# Patient Record
Sex: Male | Born: 1955 | Race: White | Hispanic: No | Marital: Married | Smoking: Never smoker
Health system: Southern US, Community
[De-identification: ages and names within clinical notes are randomized; demographics above are authoritative.]

## PROBLEM LIST (undated history)

## (undated) DIAGNOSIS — E119 Type 2 diabetes mellitus without complications: Secondary | ICD-10-CM

## (undated) DIAGNOSIS — I2510 Atherosclerotic heart disease of native coronary artery without angina pectoris: Secondary | ICD-10-CM

## (undated) HISTORY — PX: ANAL FISSURECTOMY: SUR608

## (undated) HISTORY — PX: GASTRIC BYPASS: SHX52

---

## 2018-03-16 DIAGNOSIS — I350 Nonrheumatic aortic (valve) stenosis: Secondary | ICD-10-CM | POA: Insufficient documentation

## 2018-03-27 HISTORY — PX: CARDIAC CATHETERIZATION: SHX172

## 2018-04-03 ENCOUNTER — Encounter: Payer: 59 | Admitting: Cardiothoracic Surgery

## 2018-04-04 ENCOUNTER — Institutional Professional Consult (permissible substitution) (INDEPENDENT_AMBULATORY_CARE_PROVIDER_SITE_OTHER): Payer: 59 | Admitting: Cardiothoracic Surgery

## 2018-04-04 ENCOUNTER — Encounter: Payer: Self-pay | Admitting: Cardiothoracic Surgery

## 2018-04-04 ENCOUNTER — Other Ambulatory Visit: Payer: Self-pay

## 2018-04-04 VITALS — BP 129/76 | HR 55 | Resp 16 | Ht 74.0 in | Wt 296.0 lb

## 2018-04-04 DIAGNOSIS — R0600 Dyspnea, unspecified: Secondary | ICD-10-CM | POA: Insufficient documentation

## 2018-04-04 DIAGNOSIS — I350 Nonrheumatic aortic (valve) stenosis: Secondary | ICD-10-CM

## 2018-04-04 DIAGNOSIS — I10 Essential (primary) hypertension: Secondary | ICD-10-CM | POA: Insufficient documentation

## 2018-04-04 NOTE — Progress Notes (Signed)
PCP is Patient, No Pcp Per Referring Provider is Caryl Adahiu, Jenyung Andy, MD  Chief Complaint  Patient presents with  . Aortic Stenosis    ECHO 5/21, CATH 03/21/18  Patient examined, images of coronary angiogram and transthoracic echocardiogram and CT scan of chest personally reviewed and counseled with patient.  HPI: 62 year old nondiabetic non-smoker with recent diagnosis of severe symptomatic aortic stenosis and single-vessel coronary artery disease presents for discussion of aortic valve replacement with combined CABG.  The patient developed symptoms of dyspnea on exertion starting approximately 6 months ago.  The symptoms have gradually increased in frequency and intensity and he most recently had an episode of PND.  He is also had symptoms of presyncope but denies chest pain.  He was evaluated at a walk-in clinic and was treated with oxygen and diuresis.  He was noted to have a systolic murmur and was referred to cardiology, Dr. Deno Etiennehu at [facility].  He underwent echocardiogram which showed probable calcified bicuspid valve with peak gradient 65 to 70 mmHg mean gradient of 55 mmHg and normal LVEDP.  The LAD had moderate to severe stenosis of the mid vessel confirmed by FFR measurement right-sided pressures included CVP of 5, wedge of 18, PA pressure 36/15 and LVEDP 11 cardiac output 4.7 L/min  The patient denies family history of aortic valve surgery but there is family history of coronary artery disease.  Patient is a never smoker and is not currently diabetic.   7 years ago the patient underwent Gastric stapling procedure for obesity and lost weight from over 400 pounds to 300 pounds currently.  His diabetes and hypertension resolved. He denies any cardiac difficulty with his gastric bypass surgery or difficulty with anesthesia or bleeding.  Patient denies any active dental complaints and has his teeth cleaned on a regular basis. Patient Had no knowledge of having a cardiac murmur in the  past.  Because the patient has been recommended for cardiac surgery his activity level has been significantly decreased over the past 2 weeks.  We discussed the procedure of AVR Using a tissue valve which is the patient's choice as he does not want to remain on anticoagulation lifelong..  We discussed the procedure of CABG using the mammary artery the bypass for  his LAD lesion.  History reviewed. No pertinent past medical history.  Past Surgical History:  Procedure Laterality Date  . ANAL FISSURECTOMY    . GASTRIC BYPASS      History reviewed. No pertinent family history.  Social History Social History   Tobacco Use  . Smoking status: Never Smoker  . Smokeless tobacco: Current User    Types: Chew  Substance Use Topics  . Alcohol use: Yes  . Drug use: Never    Current Outpatient Medications  Medication Sig Dispense Refill  . aspirin EC 81 MG tablet Take 81 mg by mouth daily.    Marland Kitchen. atorvastatin (LIPITOR) 80 MG tablet Take 80 mg by mouth daily.    . Glucosamine-Chondroitin 500-250 MG CAPS Take 2 capsules by mouth daily.    . hydrochlorothiazide (HYDRODIURIL) 25 MG tablet Take 25 mg by mouth daily.    . Methylcellulose, Laxative, (CITRUCEL) 500 MG TABS Take 2 tablets by mouth daily.    . metoprolol tartrate (LOPRESSOR) 25 MG tablet Take 25 mg by mouth daily.    . Multiple Vitamins-Minerals (PA MENS 50 PLUS VITAPAK PO) Take 1 tablet by mouth daily.    . Omega-3 Fatty Acids (DIALYVITE OMEGA-3 CONCENTRATE) 600 MG CAPS Take  1 capsule by mouth daily.    . Omega-3 Fatty Acids (FISH OIL) 1200 MG CPDR Take 1 capsule by mouth daily.     No current facility-administered medications for this visit.     No Known Allergies  Review of Systems                     Review of Systems :  [ y ] = yes, [  ] = no        General :  Weight gain [   ]    Weight loss  [   ]  Fatigue [yes]  Fever [  ]  Chills  [  ]                                          HEENT    Headache [  ]  Dizziness [   ]  Blurred vision [  ] Glaucoma  [  ]                          Nosebleeds [  ] Painful or loose teeth [  ]        Cardiac :  Chest pain/ pressure [yes]  Resting SOB [  ] exertional SOB [yes]                        Orthopnea [yes]  Pedal edema  [  ]  Palpitations [  ] Syncope/presyncope [ ]                         Paroxysmal nocturnal dyspnea [yes]         Pulmonary : cough [  ]  wheezing [  ]  Hemoptysis [  ] Sputum [  ] Snoring [  ]                              Pneumothorax [  ]  Sleep apnea [probable does not use BiPAP or CPAP]        GI : Vomiting [  ]  Dysphagia [  ]  Melena  [  ]  Abdominal pain [  ] BRBPR [  ]              Heart burn [  ]  Constipation [  ] Diarrhea  [  ] Colonoscopy [   ] Patient eats small meals because of gastric stapling procedure       GU : Hematuria [  ]  Dysuria [  ]  Nocturia [  ] UTI's [  ]        Vascular : Claudication [  ]  Rest pain [  ]  DVT [  ] Vein stripping [  ] leg ulcers [  ]                          TIA [  ] Stroke [  ]  Varicose veins [  ]        NEURO :  Headaches  [  ] Seizures [  ] Vision changes [  ] Paresthesias [  ]  Musculoskeletal :  Arthritis [  ] Gout  [  ]  Back pain [  ]  Joint pain [  ]        Skin :  Rash [  ]  Melanoma [  ] Sores [  ]        Heme : Bleeding problems [  ]Clotting Disorders [  ] Anemia [  ]Blood Transfusion [ ]         Endocrine : Diabetes [  ] Heat or Cold intolerance [  ] Polyuria [  ]excessive thirst [ ]         Psych : Depression [  ]  Anxiety [  ]  Psych hospitalizations [  ] Memory change [  ]       Right-hand-dominant No previous thoracic injuries or pneumothorax                                                                   BP 129/76 (BP Location: Left Arm, Patient Position: Sitting, Cuff Size: Large)   Pulse (!) 55   Resp 16   Ht 6\' 2"  (1.88 m)   Wt 296 lb (134.3 kg)   SpO2 97% Comment: ON RA  BMI 38.00 kg/m  Physical Exam      Physical  Exam  General: Obese engaging middle-aged male no acute distress accompanied by wife HEENT: Normocephalic pupils equal , dentition adequate Neck: Supple without JVD, adenopathy, or bruit Chest: Clear to auscultation, symmetrical breath sounds, no rhonchi, no tenderness             or deformity Cardiovascular: Regular rate and rhythm, high-pitched systolic ejection murmur of aortic stenosis , no gallop, peripheral pulses             palpable in all extremities Abdomen:  Soft, nontender, no palpable mass or organomegaly Extremities: Warm, well-perfused, no clubbing cyanosis edema or tenderness,              no venous stasis changes of the legs Rectal/GU: Deferred Neuro: Grossly non--focal and symmetrical throughout Skin: Clean and dry without rash or ulceration   Diagnostic Tests: CT scan shows no evidence of ascending aortic aneurysm, diameter 4.0 cm.  Lung parenchyma appears to be satisfactory, mild pleural effusion on the right side  Impression: Severe aortic stenosis with resting symptoms of PND as well as dyspnea on exertion and presyncope  Plan: I discussed the procedure of AVR-CABG in detail with the patient and wife.  I recommended AVR with a biologic prosthesis since the patient does not want lifelong anticoagulation requirement.  We will perform combined mammary artery graft to the LAD via sternotomy.  I discussed the expected postoperative recovery, the potential postoperative complications including pulmonary problems due to his obesity and his risks of combined AVR CABG including risk of stroke, bleeding, infection, organ failure, death.  He will be scheduled for AVR CABG at [facility] on June 28.  Will arrange his preoperative visit 48 hours prior to surgery.   Mikey Bussing, MD [facility] [phone]

## 2018-04-05 ENCOUNTER — Other Ambulatory Visit: Payer: Self-pay | Admitting: *Deleted

## 2018-04-05 DIAGNOSIS — I350 Nonrheumatic aortic (valve) stenosis: Secondary | ICD-10-CM

## 2018-04-05 DIAGNOSIS — I2510 Atherosclerotic heart disease of native coronary artery without angina pectoris: Secondary | ICD-10-CM

## 2018-04-11 ENCOUNTER — Ambulatory Visit (HOSPITAL_COMMUNITY)
Admission: RE | Admit: 2018-04-11 | Discharge: 2018-04-11 | Disposition: A | Discharge status: Home / Self Care | Payer: 59 | Source: Ambulatory Visit | Attending: Cardiothoracic Surgery | Admitting: Cardiothoracic Surgery

## 2018-04-11 ENCOUNTER — Encounter (HOSPITAL_COMMUNITY)
Admission: RE | Admit: 2018-04-11 | Discharge: 2018-04-11 | Disposition: A | Discharge status: Home / Self Care | Payer: 59 | Source: Ambulatory Visit | Attending: Cardiothoracic Surgery | Admitting: Cardiothoracic Surgery

## 2018-04-11 ENCOUNTER — Encounter (HOSPITAL_COMMUNITY): Payer: Self-pay

## 2018-04-11 ENCOUNTER — Other Ambulatory Visit (HOSPITAL_COMMUNITY): Payer: 59

## 2018-04-11 ENCOUNTER — Ambulatory Visit (HOSPITAL_BASED_OUTPATIENT_CLINIC_OR_DEPARTMENT_OTHER)
Admission: RE | Admit: 2018-04-11 | Discharge: 2018-04-11 | Disposition: A | Discharge status: Home / Self Care | Payer: 59 | Source: Ambulatory Visit | Attending: Cardiothoracic Surgery | Admitting: Cardiothoracic Surgery

## 2018-04-11 ENCOUNTER — Other Ambulatory Visit: Payer: Self-pay

## 2018-04-11 DIAGNOSIS — Z01818 Encounter for other preprocedural examination: Secondary | ICD-10-CM | POA: Insufficient documentation

## 2018-04-11 DIAGNOSIS — I2510 Atherosclerotic heart disease of native coronary artery without angina pectoris: Secondary | ICD-10-CM

## 2018-04-11 DIAGNOSIS — Z01812 Encounter for preprocedural laboratory examination: Secondary | ICD-10-CM

## 2018-04-11 DIAGNOSIS — Z0183 Encounter for blood typing: Secondary | ICD-10-CM

## 2018-04-11 DIAGNOSIS — I6523 Occlusion and stenosis of bilateral carotid arteries: Secondary | ICD-10-CM | POA: Insufficient documentation

## 2018-04-11 DIAGNOSIS — R001 Bradycardia, unspecified: Secondary | ICD-10-CM

## 2018-04-11 DIAGNOSIS — I350 Nonrheumatic aortic (valve) stenosis: Secondary | ICD-10-CM

## 2018-04-11 HISTORY — DX: Type 2 diabetes mellitus without complications: E11.9

## 2018-04-11 HISTORY — DX: Atherosclerotic heart disease of native coronary artery without angina pectoris: I25.10

## 2018-04-11 LAB — PULMONARY FUNCTION TEST
DL/VA % pred: 73 %
DL/VA: 3.57 ml/min/mmHg/L
DLCO cor % pred: 63 %
DLCO cor: 23.93 ml/min/mmHg
DLCO unc % pred: 54 %
DLCO unc: 20.52 ml/min/mmHg
FEF 25-75 Post: 3.93 L/sec
FEF 25-75 Pre: 3.44 L/sec
FEF2575-%Change-Post: 14 %
FEF2575-%Pred-Post: 118 %
FEF2575-%Pred-Pre: 104 %
FEV1-%Change-Post: 5 %
FEV1-%Pred-Post: 94 %
FEV1-%Pred-Pre: 89 %
FEV1-Post: 3.88 L
FEV1-Pre: 3.68 L
FEV1FVC-%Change-Post: 4 %
FEV1FVC-%Pred-Pre: 107 %
FEV6-%Change-Post: 0 %
FEV6-%Pred-Post: 88 %
FEV6-%Pred-Pre: 87 %
FEV6-Post: 4.6 L
FEV6-Pre: 4.57 L
FEV6FVC-%Pred-Post: 104 %
FEV6FVC-%Pred-Pre: 104 %
FVC-%Change-Post: 0 %
FVC-%Pred-Post: 84 %
FVC-%Pred-Pre: 84 %
FVC-Post: 4.6 L
FVC-Pre: 4.57 L
Post FEV1/FVC ratio: 84 %
Post FEV6/FVC ratio: 100 %
Pre FEV1/FVC ratio: 81 %
Pre FEV6/FVC Ratio: 100 %
RV % pred: 94 %
RV: 2.33 L
TLC % pred: 90 %
TLC: 7.07 L

## 2018-04-11 LAB — COMPREHENSIVE METABOLIC PANEL
ALT: 43 U/L (ref 0–44)
AST: 44 U/L — ABNORMAL HIGH (ref 15–41)
Albumin: 3.9 g/dL (ref 3.5–5.0)
Alkaline Phosphatase: 37 U/L — ABNORMAL LOW (ref 38–126)
Anion gap: 9 (ref 5–15)
BUN: 19 mg/dL (ref 6–20)
CO2: 25 mmol/L (ref 22–32)
Calcium: 9 mg/dL (ref 8.9–10.3)
Chloride: 106 mmol/L (ref 98–111)
Creatinine, Ser: 0.98 mg/dL (ref 0.61–1.24)
GFR calc Af Amer: >60 mL/min (ref >60)
GFR calc non Af Amer: >60 mL/min (ref >60)
Glucose, Bld: 104 mg/dL — ABNORMAL HIGH (ref 70–99)
Potassium: 4.2 mmol/L (ref 3.5–5.1)
Sodium: 140 mmol/L (ref 135–145)
Total Bilirubin: 0.6 mg/dL (ref 0.3–1.2)
Total Protein: 6.8 g/dL (ref 6.5–8.1)

## 2018-04-11 LAB — HEMOGLOBIN A1C
Hgb A1c MFr Bld: 6.2 % — ABNORMAL HIGH (ref 4.8–5.6)
Mean Plasma Glucose: 131.24 mg/dL

## 2018-04-11 LAB — BLOOD GAS, ARTERIAL
Acid-Base Excess: 1.8 mmol/L (ref 0.0–2.0)
Bicarbonate: 25.7 mmol/L (ref 20.0–28.0)
Drawn by: 244901
FIO2: 21
O2 Saturation: 95.7 %
Patient temperature: 98.6
pCO2 arterial: 39.1 mmHg (ref 32.0–48.0)
pH, Arterial: 7.433 (ref 7.350–7.450)
pO2, Arterial: 98.9 mmHg (ref 83.0–108.0)

## 2018-04-11 LAB — URINALYSIS, ROUTINE W REFLEX MICROSCOPIC
Bilirubin Urine: NEGATIVE
Glucose, UA: NEGATIVE mg/dL
Hgb urine dipstick: NEGATIVE
Ketones, ur: NEGATIVE mg/dL
Leukocytes, UA: NEGATIVE
Nitrite: NEGATIVE
Protein, ur: NEGATIVE mg/dL
Specific Gravity, Urine: 1.019 (ref 1.005–1.030)
pH: 5 (ref 5.0–8.0)

## 2018-04-11 LAB — PROTIME-INR
INR: 1.1
Prothrombin Time: 14.2 seconds (ref 11.4–15.2)

## 2018-04-11 LAB — CBC
HCT: 33.6 % — ABNORMAL LOW (ref 39.0–52.0)
Hemoglobin: 10.1 g/dL — ABNORMAL LOW (ref 13.0–17.0)
MCH: 25.3 pg — ABNORMAL LOW (ref 26.0–34.0)
MCHC: 30.1 g/dL (ref 30.0–36.0)
MCV: 84.2 fL (ref 78.0–100.0)
Platelets: 218 10*3/uL (ref 150–400)
RBC: 3.99 MIL/uL — ABNORMAL LOW (ref 4.22–5.81)
RDW: 15.3 % (ref 11.5–15.5)
WBC: 5.6 10*3/uL (ref 4.0–10.5)

## 2018-04-11 LAB — APTT: aPTT: 33 seconds (ref 24–36)

## 2018-04-11 LAB — SURGICAL PCR SCREEN
MRSA, PCR: NEGATIVE
Staphylococcus aureus: NEGATIVE

## 2018-04-11 LAB — ABO/RH: ABO/RH(D): A POS

## 2018-04-11 MED ORDER — ALBUTEROL SULFATE (2.5 MG/3ML) 0.083% IN NEBU
2.5000 mg | INHALATION_SOLUTION | Freq: Once | RESPIRATORY_TRACT | Status: AC
  Administered 2018-04-11: 2.5 mg via RESPIRATORY_TRACT

## 2018-04-11 NOTE — Progress Notes (Signed)
PCP - denies Cardiologist - denies; pt was seen by cardiologist in the emergency department at [facility]. Metoprolol and HCTZ medication prescribed by Dr. Rhona Leavenshiu and only given a limited supply.   Chest x-ray - 04/11/2018 EKG - 04/11/2018 Stress Test - denies ECHO - 03/16/18 Cardiac Cath - 03/21/2018  Sleep Study - denies; pt had positive stop bang. No PCP to report.   Aspirin Instructions: Per pt, instructed by Dr. Donata ClayVan Trigt to keep taking ASA.   Anesthesia review: Yes  Patient denies shortness of breath, fever, cough and chest pain at PAT appointment   Patient verbalized understanding of instructions that were given to them at the PAT appointment. Patient was also instructed that they will need to review over the PAT instructions again at home before surgery.  Viviano SimasMorgan N Anner Baity, RN

## 2018-04-11 NOTE — Progress Notes (Signed)
Pre-CABG testing has been completed. 1-39% ICA stenosis bilaterally.  ABI's Right 1.25 Left 1.2  Palmar Waveforms Right Palmar waveforms are diminished greater than fifty percent with radial and ulnar compression. Left Palmar waveforms are obliterated with radial and ulnar compresson.  04/11/18 4:34 PM Olen CordialGreg Shacara Cozine RVT

## 2018-04-11 NOTE — Pre-Procedure Instructions (Signed)
Kyle DUNSWORTH Jr.  04/11/2018      [facility] - [place], [place] - [address] [place] [address] Phone: [phone] Fax: [phone]    Your procedure is scheduled on June 28th at 7:30 A.M.  Report to [facility] Admitting at 5:30 A.M.  Call this number if you have problems the morning of surgery:  [phone]   Remember:  Do not eat or drink after midnight.  Continue all medications as directed by your physician except follow these medication instructions before surgery below    Take these medicines the morning of surgery with A SIP OF WATER:  Metoprolol (toprol-XL) 25mg  24 hr tablet Loratadine (claritin) 10mg -as needed for allergies  Follow your doctors instructions regarding your Aspirin.  If no instructions were given by your doctor, then you will need to call the prescribing office office to get instructions.    7 days prior to surgery STOP taking any Aspirin(unless otherwise instructed by your surgeon), Aleve, Naproxen, Ibuprofen, Motrin, Advil, Goody's, BC's, all herbal medications, fish oil, and all vitamins     Do not wear jewelry  Do not wear lotions, powders, or colognes, or deodorant.  Men may shave face and neck.  Do not bring valuables to the hospital.  [facility] is not responsible for any belongings or valuables.  Eyeglasses, contacts, hearing aids, dentures or bridgework may not be worn into surgery.  Leave your suitcase in the car.  After surgery it may be brought to your room.  For patients admitted to the hospital, discharge time will be determined by your treatment team.  Patients discharged the day of surgery will not be allowed to drive home.    [facility]- Preparing For Surgery  Before surgery, you can play an important role. Because skin is not sterile, your skin needs to be as free of germs as possible. You can reduce the number of germs on your skin by washing with CHG (chlorahexidine  gluconate) Soap before surgery.  CHG is an antiseptic cleaner which kills germs and bonds with the skin to continue killing germs even after washing.    Oral Hygiene is also important to reduce your risk of infection.  Remember - BRUSH YOUR TEETH THE MORNING OF SURGERY WITH YOUR REGULAR TOOTHPASTE  Please do not use if you have an allergy to CHG or antibacterial soaps. If your skin becomes reddened/irritated stop using the CHG.  Do not shave (including legs and underarms) for at least 48 hours prior to first CHG shower. It is OK to shave your face.  Please follow these instructions carefully.   1. Shower the NIGHT BEFORE SURGERY and the MORNING OF SURGERY with CHG.   2. If you chose to wash your hair, wash your hair first as usual with your normal shampoo.  3. After you shampoo, rinse your hair and body thoroughly to remove the shampoo.  4. Use CHG as you would any other liquid soap. You can apply CHG directly to the skin and wash gently with a scrungie or a clean washcloth.   5. Apply the CHG Soap to your body ONLY FROM THE NECK DOWN.  Do not use on open wounds or open sores. Avoid contact with your eyes, ears, mouth and genitals (private parts). Wash Face and genitals (private parts)  with your normal soap.  6. Wash thoroughly, paying special attention to the area where your surgery will be performed.  7. Thoroughly rinse  your body with warm water from the neck down.  8. DO NOT shower/wash with your normal soap after using and rinsing off the CHG Soap.  9. Pat yourself dry with a CLEAN TOWEL.  10. Wear CLEAN PAJAMAS to bed the night before surgery, wear comfortable clothes the morning of surgery  11. Place CLEAN SHEETS on your bed the night of your first shower and DO NOT SLEEP WITH PETS.    Day of Surgery:  Do not apply any deodorants/lotions.  Please wear clean clothes to the hospital/surgery center.   Remember to brush your teeth WITH YOUR REGULAR TOOTHPASTE.  Please read  over the following fact sheets that you were given. Pain Booklet, Coughing and Deep Breathing and Surgical Site Infection Prevention

## 2018-04-12 MED ORDER — NITROGLYCERIN IN D5W 200-5 MCG/ML-% IV SOLN
2.0000 ug/min | INTRAVENOUS | Status: AC
  Administered 2018-04-13: 16.6 ug/min via INTRAVENOUS
  Filled 2018-04-12: qty 250

## 2018-04-12 MED ORDER — MAGNESIUM SULFATE 50 % IJ SOLN
40.0000 meq | INTRAMUSCULAR | Status: DC
Start: 2018-04-13 — End: 2018-04-13
  Filled 2018-04-12: qty 9.85

## 2018-04-12 MED ORDER — EPINEPHRINE PF 1 MG/ML IJ SOLN
0.0000 ug/min | INTRAMUSCULAR | Status: DC
  Filled 2018-04-12: qty 4

## 2018-04-12 MED ORDER — SODIUM CHLORIDE 0.9 % IV SOLN
INTRAVENOUS | Status: DC
  Filled 2018-04-12: qty 30

## 2018-04-12 MED ORDER — SODIUM CHLORIDE 0.9 % IV SOLN
30.0000 ug/min | INTRAVENOUS | Status: AC
  Administered 2018-04-13: 20 ug/min via INTRAVENOUS
  Filled 2018-04-12: qty 2

## 2018-04-12 MED ORDER — VANCOMYCIN HCL 10 G IV SOLR
1500.0000 mg | INTRAVENOUS | Status: AC
  Administered 2018-04-13: 1500 mg via INTRAVENOUS
  Filled 2018-04-12: qty 1000

## 2018-04-12 MED ORDER — TRANEXAMIC ACID (OHS) PUMP PRIME SOLUTION
2.0000 mg/kg | INTRAVENOUS | Status: DC
  Filled 2018-04-12: qty 2.74

## 2018-04-12 MED ORDER — TRANEXAMIC ACID 1000 MG/10ML IV SOLN
1.5000 mg/kg/h | INTRAVENOUS | Status: AC
  Administered 2018-04-13: 1.5 mg/kg/h via INTRAVENOUS
  Filled 2018-04-12: qty 25

## 2018-04-12 MED ORDER — TRANEXAMIC ACID (OHS) BOLUS VIA INFUSION
15.0000 mg/kg | INTRAVENOUS | Status: AC
  Administered 2018-04-13: 2055 mg via INTRAVENOUS
  Filled 2018-04-12: qty 2055

## 2018-04-12 MED ORDER — SODIUM CHLORIDE 0.9 % IV SOLN
INTRAVENOUS | Status: AC
  Administered 2018-04-13: .8 [IU]/h via INTRAVENOUS
  Filled 2018-04-12: qty 1

## 2018-04-12 MED ORDER — DEXMEDETOMIDINE HCL IN NACL 400 MCG/100ML IV SOLN
0.1000 ug/kg/h | INTRAVENOUS | Status: AC
  Administered 2018-04-13: .5 ug/kg/h via INTRAVENOUS
  Filled 2018-04-12: qty 100

## 2018-04-12 MED ORDER — MILRINONE LACTATE IN DEXTROSE 20-5 MG/100ML-% IV SOLN
0.1250 ug/kg/min | INTRAVENOUS | Status: AC
  Administered 2018-04-13: .25 ug/kg/min via INTRAVENOUS
  Filled 2018-04-12: qty 100

## 2018-04-12 MED ORDER — POTASSIUM CHLORIDE 2 MEQ/ML IV SOLN
80.0000 meq | INTRAVENOUS | Status: DC
  Filled 2018-04-12: qty 40

## 2018-04-12 MED ORDER — TRANEXAMIC ACID 1000 MG/10ML IV SOLN
1.5000 mg/kg/h | INTRAVENOUS | Status: DC
  Filled 2018-04-12: qty 25

## 2018-04-12 MED ORDER — DOPAMINE-DEXTROSE 3.2-5 MG/ML-% IV SOLN
0.0000 ug/kg/min | INTRAVENOUS | Status: DC
  Filled 2018-04-12: qty 250

## 2018-04-12 MED ORDER — SODIUM CHLORIDE 0.9 % IV SOLN
1.5000 g | INTRAVENOUS | Status: AC
  Administered 2018-04-13: 1.5 g via INTRAVENOUS
  Administered 2018-04-13: .75 g via INTRAVENOUS
  Filled 2018-04-12: qty 1.5

## 2018-04-12 MED ORDER — SODIUM CHLORIDE 0.9 % IV SOLN
750.0000 mg | INTRAVENOUS | Status: DC
  Filled 2018-04-12: qty 750

## 2018-04-12 MED ORDER — PLASMA-LYTE 148 IV SOLN
INTRAVENOUS | Status: AC
  Administered 2018-04-13: 09:00:00
  Filled 2018-04-12: qty 2.5

## 2018-04-13 ENCOUNTER — Inpatient Hospital Stay (HOSPITAL_COMMUNITY)
Admission: RE | Admit: 2018-04-13 | Discharge: 2018-04-22 | DRG: 220 | Disposition: A | Discharge status: Home Health | Payer: 59 | Source: Ambulatory Visit | Attending: Cardiothoracic Surgery | Admitting: Cardiothoracic Surgery

## 2018-04-13 ENCOUNTER — Inpatient Hospital Stay (HOSPITAL_COMMUNITY): Payer: 59 | Admitting: Emergency Medicine

## 2018-04-13 ENCOUNTER — Encounter (HOSPITAL_COMMUNITY)
Admission: RE | Disposition: A | Discharge status: Home Health | Payer: Self-pay | Source: Ambulatory Visit | Attending: Cardiothoracic Surgery

## 2018-04-13 ENCOUNTER — Encounter (HOSPITAL_COMMUNITY): Payer: Self-pay | Admitting: Certified Registered Nurse Anesthetist

## 2018-04-13 ENCOUNTER — Inpatient Hospital Stay (HOSPITAL_COMMUNITY): Payer: 59

## 2018-04-13 ENCOUNTER — Inpatient Hospital Stay (HOSPITAL_COMMUNITY): Payer: 59 | Admitting: Certified Registered Nurse Anesthetist

## 2018-04-13 DIAGNOSIS — Z6838 Body mass index (BMI) 38.0-38.9, adult: Secondary | ICD-10-CM | POA: Diagnosis not present

## 2018-04-13 DIAGNOSIS — I4892 Unspecified atrial flutter: Secondary | ICD-10-CM | POA: Diagnosis present

## 2018-04-13 DIAGNOSIS — Z952 Presence of prosthetic heart valve: Secondary | ICD-10-CM

## 2018-04-13 DIAGNOSIS — I2510 Atherosclerotic heart disease of native coronary artery without angina pectoris: Secondary | ICD-10-CM | POA: Diagnosis present

## 2018-04-13 DIAGNOSIS — E8770 Fluid overload, unspecified: Secondary | ICD-10-CM | POA: Diagnosis not present

## 2018-04-13 DIAGNOSIS — E669 Obesity, unspecified: Secondary | ICD-10-CM | POA: Diagnosis present

## 2018-04-13 DIAGNOSIS — Z9689 Presence of other specified functional implants: Secondary | ICD-10-CM

## 2018-04-13 DIAGNOSIS — J90 Pleural effusion, not elsewhere classified: Secondary | ICD-10-CM | POA: Diagnosis not present

## 2018-04-13 DIAGNOSIS — N179 Acute kidney failure, unspecified: Secondary | ICD-10-CM | POA: Diagnosis not present

## 2018-04-13 DIAGNOSIS — Z7982 Long term (current) use of aspirin: Secondary | ICD-10-CM | POA: Diagnosis not present

## 2018-04-13 DIAGNOSIS — I480 Paroxysmal atrial fibrillation: Secondary | ICD-10-CM | POA: Diagnosis present

## 2018-04-13 DIAGNOSIS — I10 Essential (primary) hypertension: Secondary | ICD-10-CM | POA: Diagnosis present

## 2018-04-13 DIAGNOSIS — I350 Nonrheumatic aortic (valve) stenosis: Secondary | ICD-10-CM | POA: Diagnosis present

## 2018-04-13 DIAGNOSIS — Z9884 Bariatric surgery status: Secondary | ICD-10-CM

## 2018-04-13 DIAGNOSIS — Q231 Congenital insufficiency of aortic valve: Secondary | ICD-10-CM

## 2018-04-13 DIAGNOSIS — D62 Acute posthemorrhagic anemia: Secondary | ICD-10-CM | POA: Diagnosis not present

## 2018-04-13 DIAGNOSIS — K5641 Fecal impaction: Secondary | ICD-10-CM | POA: Diagnosis not present

## 2018-04-13 DIAGNOSIS — I351 Nonrheumatic aortic (valve) insufficiency: Secondary | ICD-10-CM | POA: Diagnosis not present

## 2018-04-13 DIAGNOSIS — I440 Atrioventricular block, first degree: Secondary | ICD-10-CM | POA: Diagnosis not present

## 2018-04-13 DIAGNOSIS — Q230 Congenital stenosis of aortic valve: Secondary | ICD-10-CM

## 2018-04-13 DIAGNOSIS — R0602 Shortness of breath: Secondary | ICD-10-CM

## 2018-04-13 DIAGNOSIS — Z951 Presence of aortocoronary bypass graft: Secondary | ICD-10-CM

## 2018-04-13 DIAGNOSIS — Z09 Encounter for follow-up examination after completed treatment for conditions other than malignant neoplasm: Secondary | ICD-10-CM

## 2018-04-13 HISTORY — PX: CORONARY ARTERY BYPASS GRAFT: SHX141

## 2018-04-13 HISTORY — PX: AORTIC VALVE REPLACEMENT: SHX41

## 2018-04-13 HISTORY — PX: TEE WITHOUT CARDIOVERSION: SHX5443

## 2018-04-13 LAB — CREATININE, SERUM
Creatinine, Ser: 0.9 mg/dL (ref 0.61–1.24)
GFR calc Af Amer: >60 mL/min (ref >60)
GFR calc non Af Amer: >60 mL/min (ref >60)

## 2018-04-13 LAB — POCT I-STAT, CHEM 8
BUN: 16 mg/dL (ref 6–20)
BUN: 15 mg/dL (ref 6–20)
BUN: 15 mg/dL (ref 6–20)
BUN: 15 mg/dL (ref 6–20)
BUN: 15 mg/dL (ref 6–20)
BUN: 14 mg/dL (ref 6–20)
BUN: 17 mg/dL (ref 6–20)
Calcium, Ion: 1.25 mmol/L (ref 1.15–1.40)
Calcium, Ion: 1.05 mmol/L — ABNORMAL LOW (ref 1.15–1.40)
Calcium, Ion: 1.18 mmol/L (ref 1.15–1.40)
Calcium, Ion: 1.11 mmol/L — ABNORMAL LOW (ref 1.15–1.40)
Calcium, Ion: 1.12 mmol/L — ABNORMAL LOW (ref 1.15–1.40)
Calcium, Ion: 1.23 mmol/L (ref 1.15–1.40)
Calcium, Ion: 1.17 mmol/L (ref 1.15–1.40)
Chloride: 99 mmol/L (ref 98–111)
Chloride: 101 mmol/L (ref 98–111)
Chloride: 98 mmol/L (ref 98–111)
Chloride: 100 mmol/L (ref 98–111)
Chloride: 101 mmol/L (ref 98–111)
Chloride: 102 mmol/L (ref 98–111)
Chloride: 104 mmol/L (ref 98–111)
Creatinine, Ser: 0.6 mg/dL — ABNORMAL LOW (ref 0.61–1.24)
Creatinine, Ser: 0.6 mg/dL — ABNORMAL LOW (ref 0.61–1.24)
Creatinine, Ser: 0.8 mg/dL (ref 0.61–1.24)
Creatinine, Ser: 0.6 mg/dL — ABNORMAL LOW (ref 0.61–1.24)
Creatinine, Ser: 0.6 mg/dL — ABNORMAL LOW (ref 0.61–1.24)
Creatinine, Ser: 0.6 mg/dL — ABNORMAL LOW (ref 0.61–1.24)
Creatinine, Ser: 0.8 mg/dL (ref 0.61–1.24)
Glucose, Bld: 100 mg/dL — ABNORMAL HIGH (ref 70–99)
Glucose, Bld: 102 mg/dL — ABNORMAL HIGH (ref 70–99)
Glucose, Bld: 102 mg/dL — ABNORMAL HIGH (ref 70–99)
Glucose, Bld: 195 mg/dL — ABNORMAL HIGH (ref 70–99)
Glucose, Bld: 201 mg/dL — ABNORMAL HIGH (ref 70–99)
Glucose, Bld: 169 mg/dL — ABNORMAL HIGH (ref 70–99)
Glucose, Bld: 110 mg/dL — ABNORMAL HIGH (ref 70–99)
HCT: 27 % — ABNORMAL LOW (ref 39.0–52.0)
HCT: 24 % — ABNORMAL LOW (ref 39.0–52.0)
HCT: 26 % — ABNORMAL LOW (ref 39.0–52.0)
HCT: 24 % — ABNORMAL LOW (ref 39.0–52.0)
HCT: 25 % — ABNORMAL LOW (ref 39.0–52.0)
HCT: 26 % — ABNORMAL LOW (ref 39.0–52.0)
HCT: 27 % — ABNORMAL LOW (ref 39.0–52.0)
Hemoglobin: 9.2 g/dL — ABNORMAL LOW (ref 13.0–17.0)
Hemoglobin: 8.2 g/dL — ABNORMAL LOW (ref 13.0–17.0)
Hemoglobin: 8.8 g/dL — ABNORMAL LOW (ref 13.0–17.0)
Hemoglobin: 8.2 g/dL — ABNORMAL LOW (ref 13.0–17.0)
Hemoglobin: 8.5 g/dL — ABNORMAL LOW (ref 13.0–17.0)
Hemoglobin: 8.8 g/dL — ABNORMAL LOW (ref 13.0–17.0)
Hemoglobin: 9.2 g/dL — ABNORMAL LOW (ref 13.0–17.0)
Potassium: 3.7 mmol/L (ref 3.5–5.1)
Potassium: 3.9 mmol/L (ref 3.5–5.1)
Potassium: 4 mmol/L (ref 3.5–5.1)
Potassium: 3.6 mmol/L (ref 3.5–5.1)
Potassium: 3.9 mmol/L (ref 3.5–5.1)
Potassium: 3.9 mmol/L (ref 3.5–5.1)
Potassium: 4.8 mmol/L (ref 3.5–5.1)
Sodium: 143 mmol/L (ref 135–145)
Sodium: 140 mmol/L (ref 135–145)
Sodium: 141 mmol/L (ref 135–145)
Sodium: 138 mmol/L (ref 135–145)
Sodium: 139 mmol/L (ref 135–145)
Sodium: 140 mmol/L (ref 135–145)
Sodium: 139 mmol/L (ref 135–145)
TCO2: 30 mmol/L (ref 22–32)
TCO2: 26 mmol/L (ref 22–32)
TCO2: 28 mmol/L (ref 22–32)
TCO2: 26 mmol/L (ref 22–32)
TCO2: 27 mmol/L (ref 22–32)
TCO2: 25 mmol/L (ref 22–32)
TCO2: 25 mmol/L (ref 22–32)

## 2018-04-13 LAB — PLATELET COUNT: Platelets: 187 10*3/uL (ref 150–400)

## 2018-04-13 LAB — POCT I-STAT 3, ART BLOOD GAS (G3+)
Acid-Base Excess: 2 mmol/L (ref 0.0–2.0)
Acid-Base Excess: 1 mmol/L (ref 0.0–2.0)
Bicarbonate: 26.7 mmol/L (ref 20.0–28.0)
Bicarbonate: 26.5 mmol/L (ref 20.0–28.0)
O2 Saturation: 100 %
O2 Saturation: 100 %
TCO2: 28 mmol/L (ref 22–32)
TCO2: 28 mmol/L (ref 22–32)
pCO2 arterial: 40 mmHg (ref 32.0–48.0)
pCO2 arterial: 46.2 mmHg (ref 32.0–48.0)
pH, Arterial: 7.432 (ref 7.350–7.450)
pH, Arterial: 7.367 (ref 7.350–7.450)
pO2, Arterial: 352 mmHg — ABNORMAL HIGH (ref 83.0–108.0)
pO2, Arterial: 290 mmHg — ABNORMAL HIGH (ref 83.0–108.0)

## 2018-04-13 LAB — GLUCOSE, CAPILLARY
Glucose-Capillary: 111 mg/dL — ABNORMAL HIGH (ref 70–99)
Glucose-Capillary: 112 mg/dL — ABNORMAL HIGH (ref 70–99)
Glucose-Capillary: 101 mg/dL — ABNORMAL HIGH (ref 70–99)
Glucose-Capillary: 103 mg/dL — ABNORMAL HIGH (ref 70–99)
Glucose-Capillary: 111 mg/dL — ABNORMAL HIGH (ref 70–99)

## 2018-04-13 LAB — CBC
HCT: 29.1 % — ABNORMAL LOW (ref 39.0–52.0)
Hemoglobin: 9 g/dL — ABNORMAL LOW (ref 13.0–17.0)
MCH: 26.1 pg (ref 26.0–34.0)
MCHC: 30.9 g/dL (ref 30.0–36.0)
MCV: 84.3 fL (ref 78.0–100.0)
Platelets: 189 10*3/uL (ref 150–400)
RBC: 3.45 MIL/uL — ABNORMAL LOW (ref 4.22–5.81)
RDW: 15.4 % (ref 11.5–15.5)
WBC: 16.8 10*3/uL — ABNORMAL HIGH (ref 4.0–10.5)

## 2018-04-13 LAB — MAGNESIUM: Magnesium: 2.8 mg/dL — ABNORMAL HIGH (ref 1.7–2.4)

## 2018-04-13 LAB — HEMOGLOBIN AND HEMATOCRIT, BLOOD
HCT: 24.6 % — ABNORMAL LOW (ref 39.0–52.0)
Hemoglobin: 7.7 g/dL — ABNORMAL LOW (ref 13.0–17.0)

## 2018-04-13 LAB — PREPARE RBC (CROSSMATCH)

## 2018-04-13 SURGERY — REPLACEMENT, AORTIC VALVE, OPEN
Anesthesia: General | Site: Chest

## 2018-04-13 MED ORDER — PROTAMINE SULFATE 10 MG/ML IV SOLN
INTRAVENOUS | Status: DC | PRN
  Administered 2018-04-13: 340 mg via INTRAVENOUS
  Administered 2018-04-13: 10 mg via INTRAVENOUS

## 2018-04-13 MED ORDER — FENTANYL CITRATE (PF) 250 MCG/5ML IJ SOLN
INTRAMUSCULAR | Status: AC
  Filled 2018-04-13: qty 10

## 2018-04-13 MED ORDER — DEXMEDETOMIDINE HCL IN NACL 200 MCG/50ML IV SOLN
0.0000 ug/kg/h | INTRAVENOUS | Status: DC
  Administered 2018-04-13: 0.6 ug/kg/h via INTRAVENOUS
  Filled 2018-04-13: qty 50

## 2018-04-13 MED ORDER — ACETAMINOPHEN 500 MG PO TABS
1000.0000 mg | ORAL_TABLET | Freq: Four times a day (QID) | ORAL | Status: AC
  Administered 2018-04-14 – 2018-04-18 (×18): 1000 mg via ORAL
  Filled 2018-04-13 (×17): qty 2

## 2018-04-13 MED ORDER — SODIUM CHLORIDE 0.9 % IJ SOLN
INTRAMUSCULAR | Status: AC
  Filled 2018-04-13: qty 10

## 2018-04-13 MED ORDER — MIDAZOLAM HCL 10 MG/2ML IJ SOLN
INTRAMUSCULAR | Status: AC
  Filled 2018-04-13: qty 2

## 2018-04-13 MED ORDER — MILRINONE LACTATE IN DEXTROSE 20-5 MG/100ML-% IV SOLN
0.2500 ug/kg/min | INTRAVENOUS | Status: DC
  Administered 2018-04-13 – 2018-04-16 (×4): 0.25 ug/kg/min via INTRAVENOUS
  Filled 2018-04-13 (×7): qty 100

## 2018-04-13 MED ORDER — METOCLOPRAMIDE HCL 5 MG/ML IJ SOLN
10.0000 mg | Freq: Four times a day (QID) | INTRAMUSCULAR | Status: AC
  Administered 2018-04-13 – 2018-04-18 (×21): 10 mg via INTRAVENOUS
  Filled 2018-04-13 (×18): qty 2

## 2018-04-13 MED ORDER — FENTANYL CITRATE (PF) 250 MCG/5ML IJ SOLN
INTRAMUSCULAR | Status: AC
  Filled 2018-04-13: qty 25

## 2018-04-13 MED ORDER — SODIUM CHLORIDE 0.9 % IV SOLN
INTRAVENOUS | Status: DC
  Administered 2018-04-13: 3.3 [IU]/h via INTRAVENOUS
  Filled 2018-04-13: qty 1

## 2018-04-13 MED ORDER — MORPHINE SULFATE (PF) 2 MG/ML IV SOLN
1.0000 mg | INTRAVENOUS | Status: AC | PRN
  Administered 2018-04-13: 4 mg via INTRAVENOUS
  Filled 2018-04-13: qty 2

## 2018-04-13 MED ORDER — LACTATED RINGERS IV SOLN: INTRAVENOUS | Status: DC

## 2018-04-13 MED ORDER — CHLORHEXIDINE GLUCONATE 0.12 % MT SOLN
OROMUCOSAL | Status: AC
  Filled 2018-04-13: qty 15

## 2018-04-13 MED ORDER — TRAMADOL HCL 50 MG PO TABS
50.0000 mg | ORAL_TABLET | ORAL | Status: DC | PRN
  Administered 2018-04-13 – 2018-04-20 (×8): 100 mg via ORAL
  Administered 2018-04-21: 50 mg via ORAL
  Administered 2018-04-21: 100 mg via ORAL
  Filled 2018-04-13: qty 2
  Filled 2018-04-13: qty 1
  Filled 2018-04-13 (×9): qty 2

## 2018-04-13 MED ORDER — NITROGLYCERIN IN D5W 200-5 MCG/ML-% IV SOLN: 0.0000 ug/min | INTRAVENOUS | Status: DC

## 2018-04-13 MED ORDER — LACTATED RINGERS IV SOLN
INTRAVENOUS | Status: DC | PRN
  Administered 2018-04-13 (×2): via INTRAVENOUS

## 2018-04-13 MED ORDER — INSULIN REGULAR BOLUS VIA INFUSION
0.0000 [IU] | Freq: Three times a day (TID) | INTRAVENOUS | Status: DC
  Filled 2018-04-13: qty 10

## 2018-04-13 MED ORDER — MIDAZOLAM HCL 2 MG/2ML IJ SOLN
2.0000 mg | INTRAMUSCULAR | Status: DC | PRN
  Filled 2018-04-13: qty 2

## 2018-04-13 MED ORDER — ROCURONIUM BROMIDE 50 MG/5ML IV SOLN
INTRAVENOUS | Status: AC
  Filled 2018-04-13: qty 4

## 2018-04-13 MED ORDER — HEPARIN SODIUM (PORCINE) 1000 UNIT/ML IJ SOLN
INTRAMUSCULAR | Status: AC
  Filled 2018-04-13: qty 1

## 2018-04-13 MED ORDER — CHLORHEXIDINE GLUCONATE 0.12 % MT SOLN: 15.0000 mL | Freq: Once | OROMUCOSAL | Status: DC

## 2018-04-13 MED ORDER — HEPARIN SODIUM (PORCINE) 1000 UNIT/ML IJ SOLN
INTRAMUSCULAR | Status: DC | PRN
  Administered 2018-04-13: 39000 [IU] via INTRAVENOUS
  Administered 2018-04-13: 3000 [IU] via INTRAVENOUS

## 2018-04-13 MED ORDER — ATORVASTATIN CALCIUM 80 MG PO TABS
80.0000 mg | ORAL_TABLET | Freq: Every day | ORAL | Status: DC
  Administered 2018-04-14 – 2018-04-21 (×7): 80 mg via ORAL
  Filled 2018-04-13 (×8): qty 1

## 2018-04-13 MED ORDER — ASPIRIN EC 325 MG PO TBEC
325.0000 mg | DELAYED_RELEASE_TABLET | Freq: Every day | ORAL | Status: DC
  Administered 2018-04-14 – 2018-04-21 (×8): 325 mg via ORAL
  Filled 2018-04-13 (×8): qty 1

## 2018-04-13 MED ORDER — PHENYLEPHRINE HCL-NACL 20-0.9 MG/250ML-% IV SOLN
0.0000 ug/min | INTRAVENOUS | Status: DC
  Filled 2018-04-13: qty 250

## 2018-04-13 MED ORDER — 0.9 % SODIUM CHLORIDE (POUR BTL) OPTIME
TOPICAL | Status: DC | PRN
  Administered 2018-04-13: 1000 mL

## 2018-04-13 MED ORDER — SODIUM CHLORIDE 0.9 % IV SOLN
250.0000 mL | INTRAVENOUS | Status: DC
  Administered 2018-04-20: 1 mL via INTRAVENOUS

## 2018-04-13 MED ORDER — OXYCODONE HCL 5 MG PO TABS
5.0000 mg | ORAL_TABLET | ORAL | Status: DC | PRN
  Administered 2018-04-14 – 2018-04-17 (×11): 10 mg via ORAL
  Administered 2018-04-18: 5 mg via ORAL
  Administered 2018-04-18: 10 mg via ORAL
  Administered 2018-04-18: 5 mg via ORAL
  Administered 2018-04-19 – 2018-04-21 (×9): 10 mg via ORAL
  Administered 2018-04-21 (×2): 5 mg via ORAL
  Administered 2018-04-21 – 2018-04-22 (×2): 10 mg via ORAL
  Administered 2018-04-22: 5 mg via ORAL
  Filled 2018-04-13 (×13): qty 2
  Filled 2018-04-13: qty 1
  Filled 2018-04-13 (×3): qty 2
  Filled 2018-04-13: qty 1
  Filled 2018-04-13 (×4): qty 2
  Filled 2018-04-13: qty 1
  Filled 2018-04-13 (×3): qty 2
  Filled 2018-04-13: qty 1
  Filled 2018-04-13: qty 2
  Filled 2018-04-13: qty 1

## 2018-04-13 MED ORDER — SODIUM CHLORIDE 0.45 % IV SOLN
INTRAVENOUS | Status: DC | PRN
  Administered 2018-04-13: 16:00:00 via INTRAVENOUS
  Administered 2018-04-13: 10 mL/h via INTRAVENOUS

## 2018-04-13 MED ORDER — DEXMEDETOMIDINE HCL IN NACL 200 MCG/50ML IV SOLN
INTRAVENOUS | Status: AC
  Filled 2018-04-13: qty 50

## 2018-04-13 MED ORDER — HEMOSTATIC AGENTS (NO CHARGE) OPTIME
TOPICAL | Status: DC | PRN
  Administered 2018-04-13: 1 via TOPICAL

## 2018-04-13 MED ORDER — 0.9 % SODIUM CHLORIDE (POUR BTL) OPTIME
TOPICAL | Status: DC | PRN
  Administered 2018-04-13: 6000 mL

## 2018-04-13 MED ORDER — MORPHINE SULFATE (PF) 2 MG/ML IV SOLN
2.0000 mg | INTRAVENOUS | Status: DC | PRN
  Administered 2018-04-14: 4 mg via INTRAVENOUS
  Administered 2018-04-14: 2 mg via INTRAVENOUS
  Administered 2018-04-14: 4 mg via INTRAVENOUS
  Administered 2018-04-15: 2 mg via INTRAVENOUS
  Administered 2018-04-16 (×4): 4 mg via INTRAVENOUS
  Filled 2018-04-13: qty 2
  Filled 2018-04-13: qty 1
  Filled 2018-04-13 (×4): qty 2
  Filled 2018-04-13: qty 1
  Filled 2018-04-13: qty 2

## 2018-04-13 MED ORDER — DOCUSATE SODIUM 100 MG PO CAPS
200.0000 mg | ORAL_CAPSULE | Freq: Every day | ORAL | Status: DC
Start: 2018-04-14 — End: 2018-04-22
  Administered 2018-04-14 – 2018-04-21 (×6): 200 mg via ORAL
  Filled 2018-04-13 (×7): qty 2

## 2018-04-13 MED ORDER — GLYCOPYRROLATE 0.2 MG/ML IJ SOLN
INTRAMUSCULAR | Status: DC | PRN
  Administered 2018-04-13: .2 mg via INTRAVENOUS

## 2018-04-13 MED ORDER — ONDANSETRON HCL 4 MG/2ML IJ SOLN
4.0000 mg | Freq: Four times a day (QID) | INTRAMUSCULAR | Status: DC | PRN
  Administered 2018-04-14: 4 mg via INTRAVENOUS
  Filled 2018-04-13: qty 2

## 2018-04-13 MED ORDER — MAGNESIUM SULFATE 4 GM/100ML IV SOLN
INTRAVENOUS | Status: AC
  Filled 2018-04-13: qty 100

## 2018-04-13 MED ORDER — METOPROLOL TARTRATE 12.5 MG HALF TABLET: 12.5000 mg | ORAL_TABLET | Freq: Once | ORAL | Status: DC

## 2018-04-13 MED ORDER — METOPROLOL TARTRATE 25 MG/10 ML ORAL SUSPENSION: 12.5000 mg | Freq: Two times a day (BID) | ORAL | Status: DC

## 2018-04-13 MED ORDER — SODIUM CHLORIDE 0.9% IV SOLUTION
Freq: Once | INTRAVENOUS | Status: AC
  Administered 2018-04-14: 04:00:00 via INTRAVENOUS

## 2018-04-13 MED ORDER — METOPROLOL TARTRATE 12.5 MG HALF TABLET
12.5000 mg | ORAL_TABLET | Freq: Two times a day (BID) | ORAL | Status: DC
  Administered 2018-04-14 (×2): 12.5 mg via ORAL
  Filled 2018-04-13 (×2): qty 1

## 2018-04-13 MED ORDER — ACETAMINOPHEN 160 MG/5ML PO SOLN: 650.0000 mg | Freq: Once | ORAL | Status: AC

## 2018-04-13 MED ORDER — LACTATED RINGERS IV SOLN
INTRAVENOUS | Status: DC | PRN
  Administered 2018-04-13 (×2): via INTRAVENOUS

## 2018-04-13 MED ORDER — FENTANYL CITRATE (PF) 250 MCG/5ML IJ SOLN
INTRAMUSCULAR | Status: DC | PRN
  Administered 2018-04-13: 50 ug via INTRAVENOUS
  Administered 2018-04-13 (×2): 200 ug via INTRAVENOUS
  Administered 2018-04-13: 100 ug via INTRAVENOUS
  Administered 2018-04-13 (×3): 150 ug via INTRAVENOUS
  Administered 2018-04-13: 50 ug via INTRAVENOUS
  Administered 2018-04-13: 150 ug via INTRAVENOUS
  Administered 2018-04-13: 100 ug via INTRAVENOUS
  Administered 2018-04-13: 150 ug via INTRAVENOUS
  Administered 2018-04-13 (×2): 100 ug via INTRAVENOUS
  Administered 2018-04-13: 200 ug via INTRAVENOUS
  Administered 2018-04-13: 150 ug via INTRAVENOUS

## 2018-04-13 MED ORDER — ALBUMIN HUMAN 5 % IV SOLN
250.0000 mL | INTRAVENOUS | Status: AC | PRN
  Administered 2018-04-13 (×2): 250 mL via INTRAVENOUS

## 2018-04-13 MED ORDER — KETOROLAC TROMETHAMINE 30 MG/ML IJ SOLN
30.0000 mg | Freq: Four times a day (QID) | INTRAMUSCULAR | Status: AC
  Administered 2018-04-13 (×2): 30 mg via INTRAVENOUS
  Filled 2018-04-13 (×2): qty 1

## 2018-04-13 MED ORDER — SODIUM CHLORIDE 0.9% FLUSH: 3.0000 mL | INTRAVENOUS | Status: DC | PRN

## 2018-04-13 MED ORDER — PROPOFOL 10 MG/ML IV BOLUS
INTRAVENOUS | Status: DC | PRN
  Administered 2018-04-13: 100 mg via INTRAVENOUS

## 2018-04-13 MED ORDER — ACETAMINOPHEN 160 MG/5ML PO SOLN: 1000.0000 mg | Freq: Four times a day (QID) | ORAL | Status: AC

## 2018-04-13 MED ORDER — CALCIUM GLUCONATE 10 % IV SOLN: INTRAVENOUS | Status: DC | PRN

## 2018-04-13 MED ORDER — FAMOTIDINE IN NACL 20-0.9 MG/50ML-% IV SOLN
20.0000 mg | Freq: Two times a day (BID) | INTRAVENOUS | Status: AC
  Administered 2018-04-13: 20 mg via INTRAVENOUS

## 2018-04-13 MED ORDER — ACETAMINOPHEN 650 MG RE SUPP
650.0000 mg | Freq: Once | RECTAL | Status: AC
  Administered 2018-04-13: 650 mg via RECTAL

## 2018-04-13 MED ORDER — VANCOMYCIN HCL IN DEXTROSE 1-5 GM/200ML-% IV SOLN
1000.0000 mg | Freq: Once | INTRAVENOUS | Status: AC
  Administered 2018-04-13: 1000 mg via INTRAVENOUS
  Filled 2018-04-13: qty 200

## 2018-04-13 MED ORDER — POTASSIUM CHLORIDE 10 MEQ/50ML IV SOLN
10.0000 meq | INTRAVENOUS | Status: AC
  Administered 2018-04-13 (×3): 10 meq via INTRAVENOUS

## 2018-04-13 MED ORDER — FENTANYL CITRATE (PF) 250 MCG/5ML IJ SOLN
INTRAMUSCULAR | Status: AC
  Filled 2018-04-13: qty 5

## 2018-04-13 MED ORDER — MIDAZOLAM HCL 5 MG/5ML IJ SOLN
INTRAMUSCULAR | Status: DC | PRN
  Administered 2018-04-13: 2 mg via INTRAVENOUS
  Administered 2018-04-13: 1 mg via INTRAVENOUS
  Administered 2018-04-13: 2 mg via INTRAVENOUS
  Administered 2018-04-13: 3 mg via INTRAVENOUS
  Administered 2018-04-13 (×2): 1 mg via INTRAVENOUS

## 2018-04-13 MED ORDER — PROTAMINE SULFATE 10 MG/ML IV SOLN
INTRAVENOUS | Status: AC
  Filled 2018-04-13: qty 50

## 2018-04-13 MED ORDER — CALCIUM CHLORIDE 10 % IV SOLN
INTRAVENOUS | Status: DC | PRN
  Administered 2018-04-13 (×2): 200 mg via INTRAVENOUS

## 2018-04-13 MED ORDER — BISACODYL 10 MG RE SUPP: 10.0000 mg | Freq: Every day | RECTAL | Status: DC

## 2018-04-13 MED ORDER — ROCURONIUM BROMIDE 50 MG/5ML IV SOLN
INTRAVENOUS | Status: AC
  Filled 2018-04-13: qty 2

## 2018-04-13 MED ORDER — HEMOSTATIC AGENTS (NO CHARGE) OPTIME
TOPICAL | Status: DC | PRN
  Administered 2018-04-13: 2 via TOPICAL

## 2018-04-13 MED ORDER — PROPOFOL 10 MG/ML IV BOLUS
INTRAVENOUS | Status: AC
  Filled 2018-04-13: qty 20

## 2018-04-13 MED ORDER — PHENYLEPHRINE HCL 10 MG/ML IJ SOLN
INTRAMUSCULAR | Status: DC | PRN
  Administered 2018-04-13: 80 ug via INTRAVENOUS
  Administered 2018-04-13: 40 ug via INTRAVENOUS
  Administered 2018-04-13: 120 ug via INTRAVENOUS
  Administered 2018-04-13: 80 ug via INTRAVENOUS
  Administered 2018-04-13: 40 ug via INTRAVENOUS

## 2018-04-13 MED ORDER — SODIUM CHLORIDE 0.9 % IV SOLN
INTRAVENOUS | Status: DC
  Administered 2018-04-13: 15:00:00 via INTRAVENOUS
  Administered 2018-04-14: 20 mL/h via INTRAVENOUS
  Administered 2018-04-15 – 2018-04-16 (×2): via INTRAVENOUS

## 2018-04-13 MED ORDER — PHENYLEPHRINE HCL 10 MG/ML IJ SOLN
INTRAVENOUS | Status: DC | PRN
  Administered 2018-04-13: 15 ug/min via INTRAVENOUS

## 2018-04-13 MED ORDER — SODIUM CHLORIDE 0.9 % IV SOLN
1.5000 g | Freq: Two times a day (BID) | INTRAVENOUS | Status: AC
  Administered 2018-04-13 – 2018-04-15 (×4): 1.5 g via INTRAVENOUS
  Filled 2018-04-13 (×4): qty 1.5

## 2018-04-13 MED ORDER — CHLORHEXIDINE GLUCONATE 0.12 % MT SOLN
15.0000 mL | OROMUCOSAL | Status: AC
  Administered 2018-04-13: 15 mL via OROMUCOSAL

## 2018-04-13 MED ORDER — ROCURONIUM BROMIDE 10 MG/ML (PF) SYRINGE
PREFILLED_SYRINGE | INTRAVENOUS | Status: DC | PRN
  Administered 2018-04-13: 100 mg via INTRAVENOUS
  Administered 2018-04-13: 30 mg via INTRAVENOUS
  Administered 2018-04-13: 100 mg via INTRAVENOUS
  Administered 2018-04-13: 70 mg via INTRAVENOUS
  Administered 2018-04-13: 100 mg via INTRAVENOUS

## 2018-04-13 MED ORDER — LACTATED RINGERS IV SOLN
INTRAVENOUS | Status: DC | PRN
  Administered 2018-04-13: 07:00:00 via INTRAVENOUS

## 2018-04-13 MED ORDER — SODIUM CHLORIDE 0.9 % IV SOLN
INTRAVENOUS | Status: DC | PRN
  Administered 2018-04-13: 13:00:00 via INTRAVENOUS

## 2018-04-13 MED ORDER — LACTATED RINGERS IV SOLN
INTRAVENOUS | Status: DC
  Administered 2018-04-13: 16:00:00 via INTRAVENOUS

## 2018-04-13 MED ORDER — ASPIRIN 81 MG PO CHEW
324.0000 mg | CHEWABLE_TABLET | Freq: Every day | ORAL | Status: DC
  Filled 2018-04-13 (×2): qty 4

## 2018-04-13 MED ORDER — PHENYLEPHRINE HCL-NACL 10-0.9 MG/250ML-% IV SOLN
0.0000 ug/min | INTRAVENOUS | Status: DC
  Administered 2018-04-13 (×2): 30 ug/min via INTRAVENOUS
  Filled 2018-04-13: qty 250

## 2018-04-13 MED ORDER — METOPROLOL TARTRATE 5 MG/5ML IV SOLN
2.5000 mg | INTRAVENOUS | Status: DC | PRN
  Administered 2018-04-15 – 2018-04-16 (×2): 5 mg via INTRAVENOUS
  Filled 2018-04-13 (×2): qty 5

## 2018-04-13 MED ORDER — MAGNESIUM SULFATE 4 GM/100ML IV SOLN
4.0000 g | Freq: Once | INTRAVENOUS | Status: AC
  Administered 2018-04-13: 4 g via INTRAVENOUS

## 2018-04-13 MED ORDER — PANTOPRAZOLE SODIUM 40 MG PO TBEC
40.0000 mg | DELAYED_RELEASE_TABLET | Freq: Every day | ORAL | Status: DC
  Administered 2018-04-15 – 2018-04-22 (×8): 40 mg via ORAL
  Filled 2018-04-13 (×8): qty 1

## 2018-04-13 MED ORDER — LACTATED RINGERS IV SOLN: 500.0000 mL | Freq: Once | INTRAVENOUS | Status: DC | PRN

## 2018-04-13 MED ORDER — BISACODYL 5 MG PO TBEC
10.0000 mg | DELAYED_RELEASE_TABLET | Freq: Every day | ORAL | Status: DC
  Administered 2018-04-14 – 2018-04-21 (×6): 10 mg via ORAL
  Filled 2018-04-13 (×7): qty 2

## 2018-04-13 MED ORDER — ALBUMIN HUMAN 5 % IV SOLN
INTRAVENOUS | Status: DC | PRN
  Administered 2018-04-13: 13:00:00 via INTRAVENOUS

## 2018-04-13 MED ORDER — SODIUM CHLORIDE 0.9% FLUSH
3.0000 mL | Freq: Two times a day (BID) | INTRAVENOUS | Status: DC
  Administered 2018-04-14 – 2018-04-22 (×12): 3 mL via INTRAVENOUS

## 2018-04-13 MED ORDER — SODIUM CHLORIDE 0.9 % IV SOLN
20.0000 ug | Freq: Once | INTRAVENOUS | Status: AC
  Administered 2018-04-13: 20 ug via INTRAVENOUS
  Filled 2018-04-13: qty 5

## 2018-04-13 SURGICAL SUPPLY — 109 items
ADAPTER CARDIO PERF ANTE/RETRO (ADAPTER) ×4 IMPLANT
BAG DECANTER FOR FLEXI CONT (MISCELLANEOUS) ×4 IMPLANT
BANDAGE ACE 4X5 VEL STRL LF (GAUZE/BANDAGES/DRESSINGS) IMPLANT
BANDAGE ACE 6X5 VEL STRL LF (GAUZE/BANDAGES/DRESSINGS) IMPLANT
BASKET HEART  (ORDER IN 25'S) (MISCELLANEOUS)
BASKET HEART (ORDER IN 25'S) (MISCELLANEOUS)
BASKET HEART (ORDER IN 25S) (MISCELLANEOUS) IMPLANT
BLADE CLIPPER SURG (BLADE) ×4 IMPLANT
BLADE STERNUM SYSTEM 6 (BLADE) ×4 IMPLANT
BLADE SURG 12 STRL SS (BLADE) ×4 IMPLANT
BLADE SURG 15 STRL LF DISP TIS (BLADE) ×2 IMPLANT
BLADE SURG 15 STRL SS (BLADE) ×2
BNDG GAUZE ELAST 4 BULKY (GAUZE/BANDAGES/DRESSINGS) IMPLANT
CANISTER SUCT 3000ML PPV (MISCELLANEOUS) ×4 IMPLANT
CANNULA ARTERIAL NVNT 3/8 22FR (MISCELLANEOUS) ×4 IMPLANT
CANNULA GUNDRY RCSP 15FR (MISCELLANEOUS) ×4 IMPLANT
CATH CPB KIT VANTRIGT (MISCELLANEOUS) ×4 IMPLANT
CATH HEART VENT LEFT (CATHETERS) ×2 IMPLANT
CATH RETROPLEGIA CORONARY 14FR (CATHETERS) IMPLANT
CATH ROBINSON RED A/P 18FR (CATHETERS) ×16 IMPLANT
CATH THORACIC 36FR RT ANG (CATHETERS) ×4 IMPLANT
CLIP FOGARTY SPRING 6M (CLIP) IMPLANT
CLIP VESOCCLUDE SM WIDE 24/CT (CLIP) ×4 IMPLANT
CONT SPEC 4OZ CLIKSEAL STRL BL (MISCELLANEOUS) ×4 IMPLANT
COVER SURGICAL LIGHT HANDLE (MISCELLANEOUS) IMPLANT
CRADLE DONUT ADULT HEAD (MISCELLANEOUS) ×4 IMPLANT
DRAIN CHANNEL 32F RND 10.7 FF (WOUND CARE) ×4 IMPLANT
DRAPE CARDIOVASCULAR INCISE (DRAPES) ×2
DRAPE SLUSH/WARMER DISC (DRAPES) ×4 IMPLANT
DRAPE SRG 135X102X78XABS (DRAPES) ×2 IMPLANT
DRSG AQUACEL AG ADV 3.5X14 (GAUZE/BANDAGES/DRESSINGS) ×4 IMPLANT
ELECT BLADE 4.0 EZ CLEAN MEGAD (MISCELLANEOUS) ×4
ELECT BLADE 6.5 EXT (BLADE) ×8 IMPLANT
ELECT CAUTERY BLADE 6.4 (BLADE) ×4 IMPLANT
ELECT REM PT RETURN 9FT ADLT (ELECTROSURGICAL) ×8
ELECTRODE BLDE 4.0 EZ CLN MEGD (MISCELLANEOUS) ×2 IMPLANT
ELECTRODE REM PT RTRN 9FT ADLT (ELECTROSURGICAL) ×4 IMPLANT
FELT TEFLON 1X6 (MISCELLANEOUS) ×8 IMPLANT
FLOSEAL 5ML (HEMOSTASIS) ×4 IMPLANT
GAUZE SPONGE 4X4 12PLY STRL (GAUZE/BANDAGES/DRESSINGS) ×4 IMPLANT
GLOVE BIO SURGEON STRL SZ7.5 (GLOVE) ×12 IMPLANT
GOWN STRL REUS W/ TWL LRG LVL3 (GOWN DISPOSABLE) ×12 IMPLANT
GOWN STRL REUS W/TWL LRG LVL3 (GOWN DISPOSABLE) ×12
HEMOSTAT POWDER SURGIFOAM 1G (HEMOSTASIS) IMPLANT
HEMOSTAT SURGICEL 2X14 (HEMOSTASIS) ×4 IMPLANT
INSERT FOGARTY XLG (MISCELLANEOUS) ×4 IMPLANT
KIT BASIN OR (CUSTOM PROCEDURE TRAY) ×4 IMPLANT
KIT SUCTION CATH 14FR (SUCTIONS) ×4 IMPLANT
KIT TURNOVER KIT B (KITS) ×4 IMPLANT
KIT VASOVIEW HEMOPRO VH 3000 (KITS) IMPLANT
LEAD PACING MYOCARDI (MISCELLANEOUS) ×4 IMPLANT
LINE VENT (MISCELLANEOUS) ×4 IMPLANT
MARKER GRAFT CORONARY BYPASS (MISCELLANEOUS) IMPLANT
NS IRRIG 1000ML POUR BTL (IV SOLUTION) ×24 IMPLANT
PACK E OPEN HEART (SUTURE) ×4 IMPLANT
PACK OPEN HEART (CUSTOM PROCEDURE TRAY) ×4 IMPLANT
PAD ARMBOARD 7.5X6 YLW CONV (MISCELLANEOUS) ×8 IMPLANT
PAD ELECT DEFIB RADIOL ZOLL (MISCELLANEOUS) ×4 IMPLANT
PENCIL BUTTON HOLSTER BLD 10FT (ELECTRODE) IMPLANT
POWDER SURGICEL 3.0 GRAM (HEMOSTASIS) ×8 IMPLANT
PUNCH AORTIC ROTATE 4.0MM (MISCELLANEOUS) IMPLANT
PUNCH AORTIC ROTATE 4.5MM 8IN (MISCELLANEOUS) IMPLANT
PUNCH AORTIC ROTATE 5MM 8IN (MISCELLANEOUS) IMPLANT
SET CARDIOPLEGIA MPS 5001102 (MISCELLANEOUS) ×4 IMPLANT
SPONGE LAP 18X18 X RAY DECT (DISPOSABLE) ×12 IMPLANT
SURGIFLO W/THROMBIN 8M KIT (HEMOSTASIS) IMPLANT
SUT BONE WAX W31G (SUTURE) ×4 IMPLANT
SUT ETHIBON 2 0 V 52N 30 (SUTURE) ×8 IMPLANT
SUT ETHIBON EXCEL 2-0 V-5 (SUTURE) ×8 IMPLANT
SUT ETHIBOND 2 0 SH (SUTURE) ×2
SUT ETHIBOND 2 0 SH 36X2 (SUTURE) ×2 IMPLANT
SUT ETHIBOND V-5 VALVE (SUTURE) ×12 IMPLANT
SUT MNCRL AB 4-0 PS2 18 (SUTURE) IMPLANT
SUT PROLENE 3 0 RB 1 (SUTURE) ×4 IMPLANT
SUT PROLENE 3 0 SH 1 (SUTURE) IMPLANT
SUT PROLENE 3 0 SH DA (SUTURE) IMPLANT
SUT PROLENE 3 0 SH1 36 (SUTURE) IMPLANT
SUT PROLENE 4 0 RB 1 (SUTURE) ×14
SUT PROLENE 4 0 SH DA (SUTURE) ×12 IMPLANT
SUT PROLENE 4-0 RB1 .5 CRCL 36 (SUTURE) ×14 IMPLANT
SUT PROLENE 5 0 C 1 36 (SUTURE) ×12 IMPLANT
SUT PROLENE 6 0 C 1 30 (SUTURE) IMPLANT
SUT PROLENE 6 0 CC (SUTURE) ×12 IMPLANT
SUT PROLENE 8 0 BV175 6 (SUTURE) IMPLANT
SUT PROLENE BLUE 7 0 (SUTURE) ×4 IMPLANT
SUT SILK  1 MH (SUTURE)
SUT SILK 1 MH (SUTURE) IMPLANT
SUT SILK 2 0 SH CR/8 (SUTURE) ×4 IMPLANT
SUT SILK 3 0 SH CR/8 (SUTURE) IMPLANT
SUT STEEL 6MS V (SUTURE) IMPLANT
SUT STEEL SZ 6 DBL 3X14 BALL (SUTURE) ×12 IMPLANT
SUT VIC AB 1 CTX 36 (SUTURE) ×4
SUT VIC AB 1 CTX36XBRD ANBCTR (SUTURE) ×4 IMPLANT
SUT VIC AB 2-0 CT1 27 (SUTURE)
SUT VIC AB 2-0 CT1 TAPERPNT 27 (SUTURE) IMPLANT
SUT VIC AB 2-0 CTX 27 (SUTURE) IMPLANT
SUT VIC AB 3-0 X1 27 (SUTURE) IMPLANT
SYR 10ML KIT SKIN ADHESIVE (MISCELLANEOUS) IMPLANT
SYSTEM SAHARA CHEST DRAIN ATS (WOUND CARE) ×4 IMPLANT
TAPE CLOTH SURG 4X10 WHT LF (GAUZE/BANDAGES/DRESSINGS) ×4 IMPLANT
TAPE PAPER 2X10 WHT MICROPORE (GAUZE/BANDAGES/DRESSINGS) ×4 IMPLANT
TOWEL GREEN STERILE (TOWEL DISPOSABLE) ×4 IMPLANT
TOWEL GREEN STERILE FF (TOWEL DISPOSABLE) ×4 IMPLANT
TRAY FOLEY SLVR 16FR TEMP STAT (SET/KITS/TRAYS/PACK) ×4 IMPLANT
TUBING INSUFFLATION (TUBING) IMPLANT
UNDERPAD 30X30 (UNDERPADS AND DIAPERS) ×4 IMPLANT
VALVE AORTIC SZ23 INSP/RESIL (Prosthesis & Implant Heart) ×4 IMPLANT
VENT LEFT HEART 12002 (CATHETERS) ×4
WATER STERILE IRR 1000ML POUR (IV SOLUTION) ×8 IMPLANT

## 2018-04-13 NOTE — Transfer of Care (Signed)
Immediate Anesthesia Transfer of Care Note  Patient: Kyle StainJacky J Blackerby Jr.  Procedure(s) Performed: AORTIC VALVE REPLACEMENT (AVR) (N/A Chest) CORONARY ARTERY BYPASS GRAFTING (CABG) TIMES ONE USING LEFT INTERNAL MAMMARY ARTERY (N/A Chest) TRANSESOPHAGEAL ECHOCARDIOGRAM (TEE) (N/A )  Patient Location: ICU  Anesthesia Type:General  Level of Consciousness: Patient remains intubated per anesthesia plan  Airway & Oxygen Therapy: Patient remains intubated per anesthesia plan and Patient placed on Ventilator (see vital sign flow sheet for setting)  Post-op Assessment: Report given to RN and Post -op Vital signs reviewed and stable  Post vital signs: Reviewed and stable  Last Vitals:  Vitals Value Taken Time  BP    Temp    Pulse    Resp    SpO2      Last Pain:  Vitals:   04/13/18 0542  TempSrc: Oral         Complications: No apparent anesthesia complications

## 2018-04-13 NOTE — Progress Notes (Signed)
Pre Procedure note for inpatients:   Elsie StainJacky J Kracke Jr. has been scheduled for Procedure(s): AORTIC VALVE REPLACEMENT (AVR) (N/A) CORONARY ARTERY BYPASS GRAFTING (CABG) (N/A) TRANSESOPHAGEAL ECHOCARDIOGRAM (TEE) (N/A) today. The various methods of treatment have been discussed with the patient. After consideration of the risks, benefits and treatment options the patient has consented to the planned procedure.   The patient has been seen and labs reviewed. There are no changes in the patient's condition to prevent proceeding with the planned procedure today.  Recent labs:  Lab Results  Component Value Date   WBC 5.6 04/11/2018   HGB 10.1 (L) 04/11/2018   HCT 33.6 (L) 04/11/2018   PLT 218 04/11/2018   GLUCOSE 104 (H) 04/11/2018   ALT 43 04/11/2018   AST 44 (H) 04/11/2018   NA 140 04/11/2018   K 4.2 04/11/2018   CL 106 04/11/2018   CREATININE 0.98 04/11/2018   BUN 19 04/11/2018   CO2 25 04/11/2018   INR 1.10 04/11/2018   HGBA1C 6.2 (H) 04/11/2018    Mikey BussingPeter Van Trigt III, MD 04/13/2018 7:05 AM

## 2018-04-13 NOTE — Anesthesia Preprocedure Evaluation (Addendum)
Anesthesia Evaluation  Patient identified by MRN, date of birth, ID band Patient awake    Reviewed: Allergy & Precautions, NPO status , Patient's Chart, lab work & pertinent test results  Airway Mallampati: II  TM Distance: >3 FB Neck ROM: Full    Dental  (+) Teeth Intact, Caps, Dental Advisory Given   Pulmonary neg pulmonary ROS,    breath sounds clear to auscultation       Cardiovascular hypertension, Pt. on home beta blockers + CAD   Rhythm:Regular Rate:Normal + Systolic murmurs    Neuro/Psych negative neurological ROS  negative psych ROS   GI/Hepatic negative GI ROS, Neg liver ROS,   Endo/Other  diabetes  Renal/GU      Musculoskeletal negative musculoskeletal ROS (+)   Abdominal (+) + obese,   Peds  Hematology   Anesthesia Other Findings   Reproductive/Obstetrics                            Lab Results  Component Value Date   WBC 5.6 04/11/2018   HGB 10.1 (L) 04/11/2018   HCT 33.6 (L) 04/11/2018   MCV 84.2 04/11/2018   PLT 218 04/11/2018   Lab Results  Component Value Date   CREATININE 0.98 04/11/2018   BUN 19 04/11/2018   NA 140 04/11/2018   K 4.2 04/11/2018   CL 106 04/11/2018   CO2 25 04/11/2018   Lab Results  Component Value Date   INR 1.10 04/11/2018   EKG: sinus bradycardia.  Anesthesia Physical Anesthesia Plan  ASA: IV  Anesthesia Plan: General   Post-op Pain Management:    Induction: Intravenous  PONV Risk Score and Plan: 2 and Treatment may vary due to age or medical condition  Airway Management Planned: Oral ETT  Additional Equipment: Arterial line, CVP, PA Cath, TEE and Ultrasound Guidance Line Placement  Intra-op Plan:   Post-operative Plan: Post-operative intubation/ventilation  Informed Consent: I have reviewed the patients History and Physical, chart, labs and discussed the procedure including the risks, benefits and alternatives for the  proposed anesthesia with the patient or authorized representative who has indicated his/her understanding and acceptance.   Dental advisory given  Plan Discussed with: CRNA  Anesthesia Plan Comments:        Anesthesia Quick Evaluation

## 2018-04-13 NOTE — Brief Op Note (Signed)
04/13/2018  3:16 PM  PATIENT:  Kyle StainJacky J Majano Jr.  62 y.o. male  PRE-OPERATIVE DIAGNOSIS:  CAD, AS  POST-OPERATIVE DIAGNOSIS:  CAD, AS  PROCEDURE:  Procedure(s): AORTIC VALVE REPLACEMENT (AVR) (N/A) CORONARY ARTERY BYPASS GRAFTING (CABG) TIMES ONE USING LEFT INTERNAL MAMMARY ARTERY (N/A) TRANSESOPHAGEAL ECHOCARDIOGRAM (TEE) (N/A)  SURGEON:  Surgeon(s) and Role:    Kerin Perna* Van Trigt, Peter, MD - Primary  PHYSICIAN ASSISTANT: Margaretha Glassing Conte PA-C  ASSISTANTS: none   ANESTHESIA:   general  EBL:  1000 mL   BLOOD ADMINISTERED:none  DRAINS: two chest tubes   LOCAL MEDICATIONS USED:  NONE  SPECIMEN:  No Specimen  DISPOSITION OF SPECIMEN:  N/A  COUNTS:  YES  TOURNIQUET:  * No tourniquets in log *  DICTATION: .Dragon Dictation  PLAN OF CARE: Admit to inpatient   PATIENT DISPOSITION:  ICU - intubated and hemodynamically stable.   Delay start of Pharmacological VTE agent (>24hrs) due to surgical blood loss or risk of bleeding: yes

## 2018-04-13 NOTE — Anesthesia Procedure Notes (Signed)
Procedure Name: Intubation Date/Time: 04/13/2018 8:01 AM Performed by: White, Amedeo Plenty, CRNA Pre-anesthesia Checklist: Patient identified, Emergency Drugs available, Suction available and Patient being monitored Patient Re-evaluated:Patient Re-evaluated prior to induction Oxygen Delivery Method: Circle System Utilized Preoxygenation: Pre-oxygenation with 100% oxygen Induction Type: IV induction Ventilation: Mask ventilation without difficulty Laryngoscope Size: Mac and 4 Grade View: Grade III Tube type: Oral Tube size: 8.0 mm Number of attempts: 2 (attempt by CRNA x1, MDA x1) Airway Equipment and Method: Stylet and Oral airway Placement Confirmation: ETT inserted through vocal cords under direct vision,  positive ETCO2 and breath sounds checked- equal and bilateral Secured at: 24 cm Tube secured with: Tape Dental Injury: Teeth and Oropharynx as per pre-operative assessment  Comments: DL x1 with MAC 4 by CRNA, grade III view, immediate recognition of esophageal intubation; ETT removed, DLx2 with MAC 4 by MDA, grade III view, intubation with oral 8.0 cuffed ETT. +ETCO2, BBS=, ETT secured.

## 2018-04-13 NOTE — Progress Notes (Signed)
  Echocardiogram 2D Echocardiogram has been performed.  Roosvelt MaserLane, Obi Scrima F 04/13/2018, 9:43 AM

## 2018-04-13 NOTE — Anesthesia Procedure Notes (Addendum)
Central Venous Catheter Insertion Performed by: Annye Asa, MD, anesthesiologist Start/End6/28/2019 7:16 AM, 04/13/2018 7:30 AM Patient location: Pre-op. Preanesthetic checklist: patient identified, IV checked, site marked, risks and benefits discussed, surgical consent, monitors and equipment checked, pre-op evaluation, timeout performed and anesthesia consent Position: supine Lidocaine 1% used for infiltration and patient sedated Hand hygiene performed , maximum sterile barriers used  and Seldinger technique used Catheter size: 9 Fr PA cath was placed.MAC introducer Swan type:thermodilution Procedure performed using ultrasound guided technique. Ultrasound Notes:anatomy identified, needle tip was noted to be adjacent to the nerve/plexus identified, no ultrasound evidence of intravascular and/or intraneural injection and image(s) printed for medical record Attempts: 1 Following insertion, line sutured, dressing applied and Biopatch. Post procedure assessment: blood return through all ports, free fluid flow and no air  Patient tolerated the procedure well with no immediate complications. Additional procedure comments: PA catheter:  Routine monitors. Timeout, sterile prep, drape, FBP R neck.  Supine position.  1% Lido local, finder and trocar RIJ 1st pass with US guidance.  @ lumen introducer placed over J wire. PA catheter in easily.  Sterile dressing applied.  Patient tolerated well, VSS.  Jenita Seashore, MD.

## 2018-04-13 NOTE — Progress Notes (Signed)
Patient ID: Kyle StainJacky J Muckle Jr., male   DOB: 11-Sep-1956, 62 y.o.   MRN: [id number] EVENING ROUNDS NOTE :     [address]       [place] [id number]             [phone]                 Day of Surgery Procedure(s) (LRB): AORTIC VALVE REPLACEMENT (AVR) (N/A) CORONARY ARTERY BYPASS GRAFTING (CABG) TIMES ONE USING LEFT INTERNAL MAMMARY ARTERY (N/A) TRANSESOPHAGEAL ECHOCARDIOGRAM (TEE) (N/A)  Total Length of Stay:  LOS: 0 days  BP (!) 107/57   Pulse 85   Temp 99.1 F (37.3 C)   Resp 13   Ht 6\' 2"  (1.88 m)   Wt (!) 302 lb (137 kg)   SpO2 99%   BMI 38.77 kg/m   .Intake/Output      06/28 0701 - 06/29 0700   I.V. (mL/kg) 4114 (30)   Blood 420   NG/GT 100   IV Piggyback 795.9   Total Intake(mL/kg) 5429.9 (39.6)   Urine (mL/kg/hr) 1285 (0.6)   Blood 1000   Chest Tube 180   Total Output 2465   Net +2964.9         . sodium chloride 10 mL/hr at 04/13/18 2200  . [START ON 04/14/2018] sodium chloride    . sodium chloride 20 mL/hr at 04/13/18 1430  . albumin human 250 mL (04/13/18 2114)  . cefUROXime (ZINACEF)  IV Stopped (04/13/18 1918)  . dexmedetomidine (PRECEDEX) IV infusion 0.1 mcg/kg/hr (04/13/18 2200)  . famotidine (PEPCID) IV 20 mg (04/13/18 1500)  . insulin (NOVOLIN-R) infusion 2.5 mL/hr at 04/13/18 2200  . lactated ringers    . lactated ringers 10 mL/hr at 04/13/18 2200  . lactated ringers 20 mL/hr at 04/13/18 2200  . milrinone 0.25 mcg/kg/min (04/13/18 2200)  . nitroGLYCERIN Stopped (04/13/18 1530)  . phenylephrine (NEO-SYNEPHRINE) Adult infusion 20 mcg/min (04/13/18 2200)  . vancomycin 1,000 mg (04/13/18 2253)     Lab Results  Component Value Date   WBC 16.8 (H) 04/13/2018   HGB 9.2 (L) 04/13/2018   HCT 27.0 (L) 04/13/2018   PLT 189 04/13/2018   GLUCOSE 110 (H) 04/13/2018   ALT 43 04/11/2018   AST 44 (H) 04/11/2018   NA 139 04/13/2018   K 4.8 04/13/2018   CL 104 04/13/2018   CREATININE 0.80 04/13/2018   BUN 17 04/13/2018   CO2 25  04/11/2018   INR 1.10 04/11/2018   HGBA1C 6.2 (H) 04/11/2018   Extubated, awake and alert    Delight OvensEdward B Jesus Poplin MD  Beeper [phone] Office [phone] 04/13/2018 11:06 PM

## 2018-04-13 NOTE — Anesthesia Procedure Notes (Signed)
Arterial Line Insertion Start/End6/28/2019 7:00 AM Performed by: White, Cordella RegisterKelsey Tena Janes Colegrove, CRNA, CRNA  Lidocaine 1% used for infiltration Left, radial was placed Catheter size: 20 G Hand hygiene performed  and maximum sterile barriers used  Allen's test indicative of satisfactory collateral circulation Attempts: 1 Procedure performed without using ultrasound guided technique. Following insertion, dressing applied and Biopatch. Post procedure assessment: normal  Patient tolerated the procedure well with no immediate complications.

## 2018-04-13 NOTE — Op Note (Signed)
NAME: Kyle StainLKES JR., Kyle J. MEDICAL RECORD RU:[id number] ACCOUNT [id number] DATE OF BIRTH:Aug 05, 1956 FACILITY: [facility] LOCATION: [facility] PHYSICIAN:Nadiyah Zeis VAN TRIGT III, MD  OPERATIVE REPORT  DATE OF PROCEDURE:  04/13/2018  OPERATION: 1.  Aortic valve replacement with a 23 mm Edwards tissue valve Inspiris, serial #[id number]. 2.  Coronary artery bypass graft x1 using left internal mammary artery to left anterior descending.  SURGEON:  Kerin PernaPeter Van Trigt III, MD  ASSISTANT:  Jari Favreessa Conte PA-C   PREOPERATIVE DIAGNOSES:  Bicuspid aortic valve with severe aortic stenosis, symptomatic, and 75% mid-LAD stenosis.  POSTOPERATIVE DIAGNOSES:  Bicuspid aortic valve with severe aortic stenosis, symptomatic, and 75% mid-LAD stenosis.  ANESTHESIA:  General.  CLINICAL NOTE:  The patient is an obese 300-pound male with symptoms of dyspnea on exertion and discrete episodes of syncope and known cardiac murmur.  Evaluation by his cardiologist included echocardiogram, which showed significant aortic stenosis with  a mean gradient greater than 50 mmHg.  The valve was heavily calcified, and there was LVH.  EF was approximately 50%.  The patient underwent cardiac catheterization showing a 75% to 80% stenosis of the mid-LAD, otherwise clean coronaries.  The patient  was recommended for a combined AVR CABG.  I saw the patient in consultation in the office and reviewed the images of his cardiac cath and echocardiograms and discussed those data with the patient and his wife.  I discussed the role of a combined AVR with CABG for treatment of his severe aortic  stenosis and single-vessel CAD.  I discussed the details of surgery with the patient and his wife, including the use of general anesthesia and cardiopulmonary bypass, the location of the surgical incisions, and the expected postoperative hospital  recovery period.  I discussed with the patient the risks to him of AVR-CABG including the risks of stroke, bleeding, blood  transfusion, postoperative heart block, postoperative pulmonary problems including pleural effusion, postoperative infection,  postoperative organ failure, and death.  After reviewing these issues, he demonstrated his understanding and agreed to proceed with surgery under what I felt was an informed consent.  OPERATIVE FINDINGS: 1.  Heavily calcified aortic valve successfully replaced with a 23 mm Edwards tissue valve Inspiris. 2.  Normal valve function after separation from cardiopulmonary bypass with low gradient and no paravalvular leak. 3.  No blood products required for this operation.  DESCRIPTION OF PROCEDURE:  The patient was brought to the operating room and placed supine on the operating table where general anesthesia was induced under invasive hemodynamic monitoring.  Transesophageal echo probe was placed by the anesthesia team,  which confirmed the preoperative diagnosis of severe aortic stenosis.  The patient was prepped and draped as a sterile field.  Appropriate timeout was performed.  A sternal incision was made.  The left internal mammary artery was harvested as a pedicle graft from its origin at the subclavian vessels.  The sternal retractor was placed, and the pericardium opened and suspended.  Heparin was administered.   Pursestrings were placed in the ascending aorta and right atrium.  The patient was cannulated and placed on cardiopulmonary bypass.  An LV vent was placed in the right superior pulmonary vein.  The LAD was dissected and found to be an adequate target for  grafting.  Cardioplegic cannulas were placed both antegrade and retrograde cold blood cardioplegia, and CO2 was insufflated into the operative field.  The aortic crossclamp was applied, and 1 L of cold blood cardioplegia was delivered in split doses  between the antegrade aortic and retrograde  coronary sinus catheters.  There was good cardioplegic arrest and supple temperature dropped less than 12 degrees.   Cardioplegia was delivered every 20 minutes.  The anastomosis of the left IMA to the LAD was then performed using running 8-0 Prolene.  The LAD was a 1.75 mm vessel, and the anastomosis was checked and had good blood flow after briefly releasing the pedicle bulldog in the mammary artery.  The  bulldog was reapplied, and the pedicle was secured to the epicardium.  Cardioplegia was redosed.  Attention was directed to the aortic valve.  A transverse aortotomy was performed.  The aortic valve was inspected.  It was bicuspid with fusion of the left and right coronary leaflets.  It was heavily calcified into the annulus.  The calcified leaflets  were removed and the annulus was debrided.  The outflow tract was irrigated with copious amounts of cold saline.  The annulus was sized to a 23 mm Edwards tissue valve.  The patient requested specifically a tissue valve to avoid lifelong commitment to  Coumadin.  Subannular 2-0 pledgeted Ethibond sutures were then placed around the annulus numbering 15 total.  The valve was seated and sutures tied.  The valve was checked, and there was no evidence of space for perivalvular leak, and both coronary ostium were  widely patent.  The aortotomy was closed in 2 layers using running 4-0 Prolene and air was run from the coronaries with a dose of retrograde warm blood cardioplegia and the usual deairing maneuvers.  The crossclamp was removed.  The heart was shocked back into a regular rhythm.  The coronary anastomosis and the aortotomy were checked and found to be hemostatic.  The patient was rewarmed and reperfused.  Temporary pacing wires were applied.  The lungs were expanded, and the  ventilator was resumed.  The patient was then weaned from cardiopulmonary bypass without difficulty.  Echo showed normal function of the valve with a gradient of 10 mmHg.  There was no leak.  Protamine was administered without adverse reaction.  There  was still incomplete coagulation despite  reversal of heparin, and patient was given a dose of desmopressin.  The superior pericardial fat was closed.  The anterior mediastinum and left pleural chest tubes were placed and brought out through separate  incisions.  The sternum was closed with a wire.  The patient remained stable.  The pectoralis fascia and subcutaneous layers were closed.  The skin was closed with a subcuticular Vicryl.  Sterile dressings were placed.  Total cardiopulmonary bypass time  was 147 minutes.  The patient returned to the ICU in stable condition.  LN/NUANCE  D:04/13/2018 T:04/13/2018 JOB:[id number]

## 2018-04-13 NOTE — Progress Notes (Signed)
NIF-28, VC 845

## 2018-04-13 NOTE — Procedures (Signed)
Extubation Procedure Note  Patient Details:   Name: Kyle StainJacky J Proto Jr. DOB: April 24, 1956 MRN: [id number]   Airway Documentation:    Vent end date: 04/13/18 Vent end time: 1745   Evaluation  O2 sats: stable throughout Complications: No apparent complications Patient did tolerate procedure well. Bilateral Breath Sounds: Clear   Yes, Placed on 4L [place]  SPO2 98%  Toula MoosCampbell, Kenna Seward Faulkner 04/13/2018, 5:49 PM

## 2018-04-14 ENCOUNTER — Other Ambulatory Visit: Payer: Self-pay

## 2018-04-14 ENCOUNTER — Inpatient Hospital Stay (HOSPITAL_COMMUNITY): Payer: 59

## 2018-04-14 LAB — BASIC METABOLIC PANEL
Anion gap: 6 (ref 5–15)
BUN: 17 mg/dL (ref 6–20)
CO2: 25 mmol/L (ref 22–32)
Calcium: 7.7 mg/dL — ABNORMAL LOW (ref 8.9–10.3)
Chloride: 107 mmol/L (ref 98–111)
Creatinine, Ser: 0.92 mg/dL (ref 0.61–1.24)
GFR calc Af Amer: >60 mL/min (ref >60)
GFR calc non Af Amer: >60 mL/min (ref >60)
Glucose, Bld: 125 mg/dL — ABNORMAL HIGH (ref 70–99)
Potassium: 4.5 mmol/L (ref 3.5–5.1)
Sodium: 138 mmol/L (ref 135–145)

## 2018-04-14 LAB — CBC
HCT: 25.2 % — ABNORMAL LOW (ref 39.0–52.0)
HCT: 27.3 % — ABNORMAL LOW (ref 39.0–52.0)
Hemoglobin: 7.7 g/dL — ABNORMAL LOW (ref 13.0–17.0)
Hemoglobin: 8.2 g/dL — ABNORMAL LOW (ref 13.0–17.0)
MCH: 26 pg (ref 26.0–34.0)
MCH: 26 pg (ref 26.0–34.0)
MCHC: 30.6 g/dL (ref 30.0–36.0)
MCHC: 30 g/dL (ref 30.0–36.0)
MCV: 85.1 fL (ref 78.0–100.0)
MCV: 86.7 fL (ref 78.0–100.0)
Platelets: 139 10*3/uL — ABNORMAL LOW (ref 150–400)
Platelets: 160 10*3/uL (ref 150–400)
RBC: 2.96 MIL/uL — ABNORMAL LOW (ref 4.22–5.81)
RBC: 3.15 MIL/uL — ABNORMAL LOW (ref 4.22–5.81)
RDW: 15.5 % (ref 11.5–15.5)
RDW: 15.7 % — ABNORMAL HIGH (ref 11.5–15.5)
WBC: 10.6 10*3/uL — ABNORMAL HIGH (ref 4.0–10.5)
WBC: 15 10*3/uL — ABNORMAL HIGH (ref 4.0–10.5)

## 2018-04-14 LAB — HEPATIC FUNCTION PANEL
ALT: 29 U/L (ref 0–44)
AST: 47 U/L — ABNORMAL HIGH (ref 15–41)
Albumin: 3.6 g/dL (ref 3.5–5.0)
Alkaline Phosphatase: 31 U/L — ABNORMAL LOW (ref 38–126)
Bilirubin, Direct: 0.2 mg/dL (ref 0.0–0.2)
Indirect Bilirubin: 0.5 mg/dL (ref 0.3–0.9)
Total Bilirubin: 0.7 mg/dL (ref 0.3–1.2)
Total Protein: 5.8 g/dL — ABNORMAL LOW (ref 6.5–8.1)

## 2018-04-14 LAB — GLUCOSE, CAPILLARY
Glucose-Capillary: 110 mg/dL — ABNORMAL HIGH (ref 70–99)
Glucose-Capillary: 114 mg/dL — ABNORMAL HIGH (ref 70–99)
Glucose-Capillary: 121 mg/dL — ABNORMAL HIGH (ref 70–99)
Glucose-Capillary: 123 mg/dL — ABNORMAL HIGH (ref 70–99)
Glucose-Capillary: 126 mg/dL — ABNORMAL HIGH (ref 70–99)
Glucose-Capillary: 127 mg/dL — ABNORMAL HIGH (ref 70–99)
Glucose-Capillary: 150 mg/dL — ABNORMAL HIGH (ref 70–99)
Glucose-Capillary: 170 mg/dL — ABNORMAL HIGH (ref 70–99)
Glucose-Capillary: 219 mg/dL — ABNORMAL HIGH (ref 70–99)
Glucose-Capillary: 239 mg/dL — ABNORMAL HIGH (ref 70–99)
Glucose-Capillary: 243 mg/dL — ABNORMAL HIGH (ref 70–99)

## 2018-04-14 LAB — POCT I-STAT 3, ART BLOOD GAS (G3+)
Acid-Base Excess: 1 mmol/L (ref 0.0–2.0)
Acid-base deficit: 2 mmol/L (ref 0.0–2.0)
Bicarbonate: 24 mmol/L (ref 20.0–28.0)
Bicarbonate: 27 mmol/L (ref 20.0–28.0)
O2 Saturation: 98 %
O2 Saturation: 99 %
Patient temperature: 37
Patient temperature: 37.1
TCO2: 25 mmol/L (ref 22–32)
TCO2: 28 mmol/L (ref 22–32)
pCO2 arterial: 45.3 mmHg (ref 32.0–48.0)
pCO2 arterial: 47.4 mmHg (ref 32.0–48.0)
pH, Arterial: 7.331 — ABNORMAL LOW (ref 7.350–7.450)
pH, Arterial: 7.363 (ref 7.350–7.450)
pO2, Arterial: 121 mmHg — ABNORMAL HIGH (ref 83.0–108.0)
pO2, Arterial: 130 mmHg — ABNORMAL HIGH (ref 83.0–108.0)

## 2018-04-14 LAB — POCT I-STAT, CHEM 8
BUN: 21 mg/dL — ABNORMAL HIGH (ref 6–20)
Calcium, Ion: 1.12 mmol/L — ABNORMAL LOW (ref 1.15–1.40)
Chloride: 98 mmol/L (ref 98–111)
Creatinine, Ser: 1.4 mg/dL — ABNORMAL HIGH (ref 0.61–1.24)
Glucose, Bld: 266 mg/dL — ABNORMAL HIGH (ref 70–99)
HCT: 26 % — ABNORMAL LOW (ref 39.0–52.0)
Hemoglobin: 8.8 g/dL — ABNORMAL LOW (ref 13.0–17.0)
Potassium: 4.6 mmol/L (ref 3.5–5.1)
Sodium: 134 mmol/L — ABNORMAL LOW (ref 135–145)
TCO2: 24 mmol/L (ref 22–32)

## 2018-04-14 LAB — MAGNESIUM
Magnesium: 2.5 mg/dL — ABNORMAL HIGH (ref 1.7–2.4)
Magnesium: 2.5 mg/dL — ABNORMAL HIGH (ref 1.7–2.4)

## 2018-04-14 LAB — TSH: TSH: 12.629 u[IU]/mL — ABNORMAL HIGH (ref 0.350–4.500)

## 2018-04-14 LAB — CREATININE, SERUM
Creatinine, Ser: 1.49 mg/dL — ABNORMAL HIGH (ref 0.61–1.24)
GFR calc Af Amer: 57 mL/min — ABNORMAL LOW (ref >60)
GFR calc non Af Amer: 49 mL/min — ABNORMAL LOW (ref >60)

## 2018-04-14 LAB — DIGOXIN LEVEL: Digoxin Level: <0.2 ng/mL — ABNORMAL LOW (ref 0.8–2.0)

## 2018-04-14 MED ORDER — AMIODARONE HCL IN DEXTROSE 360-4.14 MG/200ML-% IV SOLN
INTRAVENOUS | Status: AC
  Administered 2018-04-14: 60 mg/h
  Filled 2018-04-14: qty 200

## 2018-04-14 MED ORDER — AMIODARONE LOAD VIA INFUSION
150.0000 mg | Freq: Once | INTRAVENOUS | Status: AC
  Administered 2018-04-14: 150 mg via INTRAVENOUS
  Filled 2018-04-14: qty 83.34

## 2018-04-14 MED ORDER — AMIODARONE HCL IN DEXTROSE 360-4.14 MG/200ML-% IV SOLN
30.0000 mg/h | INTRAVENOUS | Status: DC
  Administered 2018-04-15 – 2018-04-18 (×8): 30 mg/h via INTRAVENOUS
  Filled 2018-04-14 (×8): qty 200

## 2018-04-14 MED ORDER — AMIODARONE HCL IN DEXTROSE 360-4.14 MG/200ML-% IV SOLN: 60.0000 mg/h | INTRAVENOUS | Status: AC

## 2018-04-14 MED ORDER — ENOXAPARIN SODIUM 30 MG/0.3ML ~~LOC~~ SOLN
30.0000 mg | Freq: Every day | SUBCUTANEOUS | Status: DC
  Administered 2018-04-14 – 2018-04-15 (×2): 30 mg via SUBCUTANEOUS
  Filled 2018-04-14 (×2): qty 0.3

## 2018-04-14 MED ORDER — INSULIN DETEMIR 100 UNIT/ML ~~LOC~~ SOLN
15.0000 [IU] | Freq: Once | SUBCUTANEOUS | Status: AC
  Administered 2018-04-14: 15 [IU] via SUBCUTANEOUS
  Filled 2018-04-14: qty 0.15

## 2018-04-14 MED ORDER — FUROSEMIDE 10 MG/ML IJ SOLN
INTRAMUSCULAR | Status: AC
  Administered 2018-04-14: 17:00:00
  Filled 2018-04-14: qty 4

## 2018-04-14 MED ORDER — INSULIN DETEMIR 100 UNIT/ML ~~LOC~~ SOLN
15.0000 [IU] | Freq: Every day | SUBCUTANEOUS | Status: DC
  Administered 2018-04-15 – 2018-04-17 (×3): 15 [IU] via SUBCUTANEOUS
  Filled 2018-04-14 (×4): qty 0.15

## 2018-04-14 MED ORDER — INSULIN ASPART 100 UNIT/ML ~~LOC~~ SOLN
0.0000 [IU] | SUBCUTANEOUS | Status: DC
  Administered 2018-04-14 (×2): 8 [IU] via SUBCUTANEOUS
  Administered 2018-04-14: 2 [IU] via SUBCUTANEOUS
  Administered 2018-04-15 (×2): 4 [IU] via SUBCUTANEOUS
  Administered 2018-04-15: 2 [IU] via SUBCUTANEOUS
  Administered 2018-04-15: 8 [IU] via SUBCUTANEOUS
  Administered 2018-04-15: 12 [IU] via SUBCUTANEOUS
  Administered 2018-04-15: 2 [IU] via SUBCUTANEOUS
  Administered 2018-04-16: 4 [IU] via SUBCUTANEOUS
  Administered 2018-04-16: 8 [IU] via SUBCUTANEOUS
  Administered 2018-04-16 (×3): 2 [IU] via SUBCUTANEOUS
  Administered 2018-04-16: 8 [IU] via SUBCUTANEOUS
  Administered 2018-04-17 (×2): 2 [IU] via SUBCUTANEOUS
  Administered 2018-04-17 (×3): 4 [IU] via SUBCUTANEOUS
  Administered 2018-04-18: 2 [IU] via SUBCUTANEOUS

## 2018-04-14 MED ORDER — FUROSEMIDE 10 MG/ML IJ SOLN
40.0000 mg | Freq: Once | INTRAMUSCULAR | Status: AC
  Administered 2018-04-14: 40 mg via INTRAVENOUS

## 2018-04-14 NOTE — Progress Notes (Signed)
Patient ID: Kyle Weaver., male   DOB: 1956/02/12, 62 y.o.   MRN: [id number] TCTS DAILY ICU PROGRESS NOTE                   [address]            Kyle Weaver,[name] [id number]          [phone]   1 Day Post-Op Procedure(s) (LRB): AORTIC VALVE REPLACEMENT (AVR) (N/A) CORONARY ARTERY BYPASS GRAFTING (CABG) TIMES ONE USING LEFT INTERNAL MAMMARY ARTERY (N/A) TRANSESOPHAGEAL ECHOCARDIOGRAM (TEE) (N/A)  Total Length of Stay:  LOS: 1 day   Subjective: Awake alert neurologically intact, sinus rhythm with pacer off rate 75  Objective: Vital signs in last 24 hours: Temp:  [98.1 F (36.7 C)-99.3 F (37.4 C)] 99.3 F (37.4 C) (06/29 0900) Pulse Rate:  [83-92] 86 (06/29 0920) Cardiac Rhythm: Atrial paced (06/29 0750) Resp:  [12-29] 29 (06/29 0900) BP: (100-131)/(52-66) 125/61 (06/29 0920) SpO2:  [96 %-100 %] 99 % (06/29 0900) Arterial Line BP: (102-186)/(43-66) 173/58 (06/29 0900) FiO2 (%):  [40 %-50 %] 40 % (06/28 1716) Weight:  [315 lb 11.2 oz (143.2 kg)] 315 lb 11.2 oz (143.2 kg) (06/29 0400)  Filed Weights   04/13/18 0542 04/14/18 0400  Weight: (!) 302 lb (137 kg) (!) 315 lb 11.2 oz (143.2 kg)    Weight change: 13 lb 11.2 oz (6.214 kg)   Hemodynamic parameters for last 24 hours: PAP: (25-47)/(13-31) 46/21 CO:  [3.1 L/min-10.3 L/min] 10.3 L/min CI:  [2 L/min/m2-5.4 L/min/m2] 3.9 L/min/m2  Intake/Output from previous day: 06/28 0701 - 06/29 0700 In: 6397.3 [I.V.:4631.5; Blood:420; NG/GT:100; IV Piggyback:1245.8] Out: 3265 [Urine:1805; Blood:1000; Chest Tube:460]  Intake/Output this shift: Total I/O In: 311.1 [I.V.:311.1] Out: 185 [Urine:115; Chest Tube:70]  Current Meds: Scheduled Meds: . acetaminophen  1,000 mg Oral Q6H   Or  . acetaminophen (TYLENOL) oral liquid 160 mg/5 mL  1,000 mg Per Tube Q6H  . aspirin EC  325 mg Oral Daily   Or  . aspirin  324 mg Per Tube Daily  . atorvastatin  80 mg Oral q1800  . bisacodyl  10 mg Oral Daily   Or  .  bisacodyl  10 mg Rectal Daily  . docusate sodium  200 mg Oral Daily  . enoxaparin (LOVENOX) injection  30 mg Subcutaneous QHS  . insulin aspart  0-24 Units Subcutaneous Q4H  . insulin detemir  15 Units Subcutaneous Once  . [START ON 04/15/2018] insulin detemir  15 Units Subcutaneous Daily  . insulin regular  0-10 Units Intravenous TID WC  . metoCLOPramide (REGLAN) injection  10 mg Intravenous Q6H  . metoprolol tartrate  12.5 mg Oral BID   Or  . metoprolol tartrate  12.5 mg Per Tube BID  . [START ON 04/15/2018] pantoprazole  40 mg Oral Daily  . sodium chloride flush  3 mL Intravenous Q12H   Continuous Infusions: . sodium chloride 10 mL/hr at 04/14/18 0700  . sodium chloride    . sodium chloride 20 mL/hr at 04/14/18 0900  . albumin human 250 mL (04/13/18 2326)  . cefUROXime (ZINACEF)  IV 1.5 g (04/14/18 0746)  . dexmedetomidine (PRECEDEX) IV infusion Stopped (04/14/18 0600)  . insulin (NOVOLIN-R) infusion 8 Units/hr (04/14/18 0900)  . lactated ringers    . lactated ringers 10 mL/hr at 04/14/18 0700  . lactated ringers 20 mL/hr at 04/14/18 0700  . milrinone 0.25 mcg/kg/min (04/14/18 0900)  . nitroGLYCERIN Stopped (04/13/18 1530)  . phenylephrine (NEO-SYNEPHRINE)  Adult infusion Stopped (04/14/18 0126)   PRN Meds:.sodium chloride, albumin human, lactated ringers, metoprolol tartrate, midazolam, morphine injection, ondansetron (ZOFRAN) IV, oxyCODONE, sodium chloride flush, traMADol  General appearance: alert and cooperative Neurologic: intact Heart: regular rate and rhythm, S1, S2 normal, no murmur, click, rub or gallop Lungs: clear to auscultation bilaterally Abdomen: soft, non-tender; bowel sounds normal; no masses,  no organomegaly Extremities: extremities normal, atraumatic, no cyanosis or edema and Homans sign is negative, no sign of DVT Wound: Sternum stable  Lab Results: CBC: Recent Labs    04/13/18 2018 04/13/18 2025 04/14/18 0400  WBC 16.8*  --  10.6*  HGB 9.0* 9.2*  7.7*  HCT 29.1* 27.0* 25.2*  PLT 189  --  139*   BMET:  Recent Labs    04/11/18 1452  04/13/18 2025 04/14/18 0400  NA 140   < > 139 138  K 4.2   < > 4.8 4.5  CL 106   < > 104 107  CO2 25  --   --  25  GLUCOSE 104*   < > 110* 125*  BUN 19   < > 17 17  CREATININE 0.98   < > 0.80 0.92  CALCIUM 9.0  --   --  7.7*   < > = values in this interval not displayed.    CMET: Lab Results  Component Value Date   WBC 10.6 (H) 04/14/2018   HGB 7.7 (L) 04/14/2018   HCT 25.2 (L) 04/14/2018   PLT 139 (L) 04/14/2018   GLUCOSE 125 (H) 04/14/2018   ALT 43 04/11/2018   AST 44 (H) 04/11/2018   NA 138 04/14/2018   K 4.5 04/14/2018   CL 107 04/14/2018   CREATININE 0.92 04/14/2018   BUN 17 04/14/2018   CO2 25 04/14/2018   INR 1.10 04/11/2018   HGBA1C 6.2 (H) 04/11/2018      PT/INR:  Recent Labs    04/11/18 1452  LABPROT 14.2  INR 1.10   Radiology: Dg Chest Port 1 View  Result Date: 04/14/2018 CLINICAL DATA:  Patient with chest tube in place. History CABG procedure. EXAM: PORTABLE CHEST 1 VIEW COMPARISON:  Chest radiograph 04/13/2018 FINDINGS: Monitoring leads overlie the patient. Interval extubation. Right IJ central venous catheter sheath. PA catheter projects over the main pulmonary artery. Left chest tube in place. Stable cardiomegaly. Low lung volumes. Small left pleural effusion with underlying atelectasis. No pneumothorax. IMPRESSION: Low lung volumes. Small left pleural effusion with left basilar atelectasis. Electronically Signed   By: Annia Belt M.D.   On: 04/14/2018 08:03   Dg Chest Port 1 View  Result Date: 04/13/2018 CLINICAL DATA:  Followup CABG EXAM: PORTABLE CHEST 1 VIEW COMPARISON:  04/11/2018 FINDINGS: Previous median sternotomy and aortic valve replacement. Endotracheal tube tip is 4 cm above the carina. Nasogastric tube has its tip just in the stomach. Swan-Ganz catheter introduced from a right internal jugular approach has its tip in the right main pulmonary artery.  Left chest tube is in place. No pneumothorax. There is basilar atelectasis left more than right. No pulmonary edema. IMPRESSION: Lines and tubes well positioned following aortic valve replacement. Basilar atelectasis left more than right. No pneumothorax. Electronically Signed   By: Paulina Fusi M.D.   On: 04/13/2018 15:53     Assessment/Plan: S/P Procedure(s) (LRB): AORTIC VALVE REPLACEMENT (AVR) (N/A) CORONARY ARTERY BYPASS GRAFTING (CABG) TIMES ONE USING LEFT INTERNAL MAMMARY ARTERY (N/A) TRANSESOPHAGEAL ECHOCARDIOGRAM (TEE) (N/A) Mobilize Diuresis Diabetes control d/c tubes/lines See progression orders Expected Acute  Blood - loss Anemia- continue to monitor  Renal function stable    Delight Ovens 04/14/2018 10:24 AM

## 2018-04-14 NOTE — Progress Notes (Signed)
Patient ID: Kyle StainJacky J Schanz Jr., male   DOB: 1956/01/31, 62 y.o.   MRN: [id number] EVENING ROUNDS NOTE :     [address]       [place] [id number]             [phone]                 1 Day Post-Op Procedure(s) (LRB): AORTIC VALVE REPLACEMENT (AVR) (N/A) CORONARY ARTERY BYPASS GRAFTING (CABG) TIMES ONE USING LEFT INTERNAL MAMMARY ARTERY (N/A) TRANSESOPHAGEAL ECHOCARDIOGRAM (TEE) (N/A)  Total Length of Stay:  LOS: 1 day  BP 140/70   Pulse 77   Temp 98.4 F (36.9 C) (Oral)   Resp 17   Ht 6\' 2"  (1.88 m)   Wt (!) 315 lb 11.2 oz (143.2 kg)   SpO2 96%   BMI 40.53 kg/m   .Intake/Output      06/29 0701 - 06/30 0700   P.O. 360   I.V. (mL/kg) 513.5 (3.6)   Blood    NG/GT    IV Piggyback    Total Intake(mL/kg) 873.5 (6.1)   Urine (mL/kg/hr) 540 (0.3)   Blood    Chest Tube 300   Total Output 840   Net +33.5         . sodium chloride 10 mL/hr at 04/14/18 0700  . sodium chloride    . sodium chloride 20 mL/hr at 04/14/18 1500  . amiodarone     Followed by  . amiodarone    . cefUROXime (ZINACEF)  IV 1.5 g (04/14/18 0746)  . dexmedetomidine (PRECEDEX) IV infusion Stopped (04/14/18 0600)  . insulin (NOVOLIN-R) infusion Stopped (04/14/18 1200)  . lactated ringers    . lactated ringers 10 mL/hr at 04/14/18 0700  . lactated ringers 20 mL/hr at 04/14/18 0700  . milrinone 0.25 mcg/kg/min (04/14/18 1700)  . nitroGLYCERIN Stopped (04/13/18 1530)  . phenylephrine (NEO-SYNEPHRINE) Adult infusion Stopped (04/14/18 0126)     Lab Results  Component Value Date   WBC 15.0 (H) 04/14/2018   HGB 8.8 (L) 04/14/2018   HCT 26.0 (L) 04/14/2018   PLT 160 04/14/2018   GLUCOSE 266 (H) 04/14/2018   ALT 29 04/14/2018   AST 47 (H) 04/14/2018   NA 134 (L) 04/14/2018   K 4.6 04/14/2018   CL 98 04/14/2018   CREATININE 1.40 (H) 04/14/2018   BUN 21 (H) 04/14/2018   CO2 25 04/14/2018   TSH 12.629 (H) 04/14/2018   INR 1.10 04/11/2018   HGBA1C 6.2 (H) 04/11/2018    Episode  of afib, started on amniodrone now sinus 70's   Delight OvensEdward B Tandy Grawe MD  Beeper [phone] Office [phone] 04/14/2018 7:05 PM

## 2018-04-14 NOTE — Anesthesia Postprocedure Evaluation (Signed)
Anesthesia Post Note  Patient: Kyle StainJacky J Reier Jr.  Procedure(s) Performed: AORTIC VALVE REPLACEMENT (AVR) (N/A Chest) CORONARY ARTERY BYPASS GRAFTING (CABG) TIMES ONE USING LEFT INTERNAL MAMMARY ARTERY (N/A Chest) TRANSESOPHAGEAL ECHOCARDIOGRAM (TEE) (N/A )     Patient location during evaluation: SICU Anesthesia Type: General Level of consciousness: awake and alert Pain management: pain level controlled Vital Signs Assessment: post-procedure vital signs reviewed and stable Respiratory status: spontaneous breathing Cardiovascular status: stable Postop Assessment: no apparent nausea or vomiting Anesthetic complications: no Comments: Doing well. No complaints. Pain well controlled. Weaning pressors.     Last Vitals:  Vitals:   04/14/18 0900 04/14/18 0920  BP: 125/61 125/61  Pulse: 92 86  Resp: (!) 29   Temp: 37.4 C   SpO2: 99%     Last Pain:  Vitals:   04/14/18 0800  TempSrc:   PainSc: 3                  Shelton SilvasKevin D Hollis

## 2018-04-14 NOTE — Progress Notes (Signed)
Change in rhythm noted on bedside ekg.  12 lead ekg complete.  Pt asymptomatic.  VSS.  Will continue to closely monitor.

## 2018-04-14 NOTE — Progress Notes (Signed)
Dr Tyrone SageGerhardt on unit and notified of pt's rhythm change.  12 lead EKG shown to MD.  Amiodarone order set initiated.  Will continue to closely monitor.

## 2018-04-15 ENCOUNTER — Inpatient Hospital Stay (HOSPITAL_COMMUNITY): Payer: 59

## 2018-04-15 ENCOUNTER — Other Ambulatory Visit: Payer: Self-pay

## 2018-04-15 LAB — BASIC METABOLIC PANEL
Anion gap: 7 (ref 5–15)
Anion gap: 7 (ref 5–15)
BUN: 23 mg/dL — ABNORMAL HIGH (ref 6–20)
BUN: 27 mg/dL — ABNORMAL HIGH (ref 6–20)
CO2: 26 mmol/L (ref 22–32)
CO2: 27 mmol/L (ref 22–32)
Calcium: 7.7 mg/dL — ABNORMAL LOW (ref 8.9–10.3)
Calcium: 7.7 mg/dL — ABNORMAL LOW (ref 8.9–10.3)
Chloride: 99 mmol/L (ref 98–111)
Chloride: 94 mmol/L — ABNORMAL LOW (ref 98–111)
Creatinine, Ser: 1.6 mg/dL — ABNORMAL HIGH (ref 0.61–1.24)
Creatinine, Ser: 1.5 mg/dL — ABNORMAL HIGH (ref 0.61–1.24)
GFR calc Af Amer: 52 mL/min — ABNORMAL LOW (ref >60)
GFR calc Af Amer: 57 mL/min — ABNORMAL LOW (ref >60)
GFR calc non Af Amer: 45 mL/min — ABNORMAL LOW (ref >60)
GFR calc non Af Amer: 49 mL/min — ABNORMAL LOW (ref >60)
Glucose, Bld: 170 mg/dL — ABNORMAL HIGH (ref 70–99)
Glucose, Bld: 303 mg/dL — ABNORMAL HIGH (ref 70–99)
Potassium: 4.8 mmol/L (ref 3.5–5.1)
Potassium: 4.7 mmol/L (ref 3.5–5.1)
Sodium: 132 mmol/L — ABNORMAL LOW (ref 135–145)
Sodium: 128 mmol/L — ABNORMAL LOW (ref 135–145)

## 2018-04-15 LAB — GLUCOSE, CAPILLARY
Glucose-Capillary: 130 mg/dL — ABNORMAL HIGH (ref 70–99)
Glucose-Capillary: 164 mg/dL — ABNORMAL HIGH (ref 70–99)
Glucose-Capillary: 195 mg/dL — ABNORMAL HIGH (ref 70–99)
Glucose-Capillary: 196 mg/dL — ABNORMAL HIGH (ref 70–99)
Glucose-Capillary: 253 mg/dL — ABNORMAL HIGH (ref 70–99)
Glucose-Capillary: 257 mg/dL — ABNORMAL HIGH (ref 70–99)

## 2018-04-15 LAB — CBC
HCT: 26.6 % — ABNORMAL LOW (ref 39.0–52.0)
Hemoglobin: 7.9 g/dL — ABNORMAL LOW (ref 13.0–17.0)
MCH: 25.6 pg — ABNORMAL LOW (ref 26.0–34.0)
MCHC: 29.7 g/dL — ABNORMAL LOW (ref 30.0–36.0)
MCV: 86.4 fL (ref 78.0–100.0)
Platelets: 186 10*3/uL (ref 150–400)
RBC: 3.08 MIL/uL — ABNORMAL LOW (ref 4.22–5.81)
RDW: 15.5 % (ref 11.5–15.5)
WBC: 17.5 10*3/uL — ABNORMAL HIGH (ref 4.0–10.5)

## 2018-04-15 LAB — MAGNESIUM: Magnesium: 2.3 mg/dL (ref 1.7–2.4)

## 2018-04-15 MED ORDER — NICOTINE 14 MG/24HR TD PT24
14.0000 mg | MEDICATED_PATCH | Freq: Every day | TRANSDERMAL | Status: DC
  Administered 2018-04-15 – 2018-04-22 (×8): 14 mg via TRANSDERMAL
  Filled 2018-04-15 (×8): qty 1

## 2018-04-15 MED ORDER — METOPROLOL TARTRATE 25 MG/10 ML ORAL SUSPENSION
25.0000 mg | Freq: Two times a day (BID) | ORAL | Status: DC
Start: 2018-04-15 — End: 2018-04-19

## 2018-04-15 MED ORDER — AMIODARONE LOAD VIA INFUSION
150.0000 mg | Freq: Once | INTRAVENOUS | Status: AC
  Administered 2018-04-15: 150 mg via INTRAVENOUS
  Filled 2018-04-15: qty 83.34

## 2018-04-15 MED ORDER — NICOTINE 7 MG/24HR TD PT24: 7.0000 mg | MEDICATED_PATCH | Freq: Every day | TRANSDERMAL | Status: DC

## 2018-04-15 MED ORDER — ORAL CARE MOUTH RINSE
15.0000 mL | Freq: Two times a day (BID) | OROMUCOSAL | Status: DC
  Administered 2018-04-15 – 2018-04-22 (×9): 15 mL via OROMUCOSAL

## 2018-04-15 MED ORDER — METOPROLOL TARTRATE 25 MG PO TABS
25.0000 mg | ORAL_TABLET | Freq: Two times a day (BID) | ORAL | Status: DC
  Administered 2018-04-15 – 2018-04-18 (×7): 25 mg via ORAL
  Filled 2018-04-15 (×7): qty 1

## 2018-04-15 MED ORDER — FUROSEMIDE 10 MG/ML IJ SOLN
40.0000 mg | Freq: Once | INTRAMUSCULAR | Status: AC
  Administered 2018-04-15: 40 mg via INTRAVENOUS
  Filled 2018-04-15: qty 4

## 2018-04-15 MED ORDER — LEVALBUTEROL HCL 0.63 MG/3ML IN NEBU
0.6300 mg | INHALATION_SOLUTION | Freq: Four times a day (QID) | RESPIRATORY_TRACT | Status: DC
  Administered 2018-04-15: 0.63 mg via RESPIRATORY_TRACT
  Filled 2018-04-15: qty 3

## 2018-04-15 NOTE — Progress Notes (Signed)
Patient ID: Kyle Weaver., male   DOB: 04/02/1956, 62 y.o.   MRN: [id number] T[facility] DAILY ICU PROGRESS NOTE                   [address]            [place] [address]          [phone]   2 Days Post-Op Procedure(s) (LRB): AORTIC VALVE REPLACEMENT (AVR) (N/A) CORONARY ARTERY BYPASS GRAFTING (CABG) TIMES ONE USING LEFT INTERNAL MAMMARY ARTERY (N/A) TRANSESOPHAGEAL ECHOCARDIOGRAM (TEE) (N/A)  Total Length of Stay:  LOS: 2 days   Subjective: Patient up in chair awake alert neurologically intact, does not like the food  Objective: Vital signs in last 24 hours: Temp:  [97.6 F (36.4 C)-99.3 F (37.4 C)] 97.6 F (36.4 C) (06/30 0749) Pulse Rate:  [40-135] 82 (06/30 0800) Cardiac Rhythm: (P) Atrial fibrillation;Atrial flutter (06/30 0800) Resp:  [10-29] 13 (06/30 0800) BP: (112-151)/(47-121) 125/68 (06/30 0800) SpO2:  [94 %-99 %] 94 % (06/30 0800) Arterial Line BP: (173)/(58) 173/58 (06/29 0900) Weight:  [315 lb 14.7 oz (143.3 kg)] 315 lb 14.7 oz (143.3 kg) (06/30 0500)  Filed Weights   04/13/18 0542 04/14/18 0400 04/15/18 0500  Weight: (!) 302 lb (137 kg) (!) 315 lb 11.2 oz (143.2 kg) (!) 315 lb 14.7 oz (143.3 kg)    Weight change: 3.5 oz (0.1 kg)   Hemodynamic parameters for last 24 hours: PAP: (46)/(21) 46/21 CO:  [10.3 L/min] 10.3 L/min CI:  [3.9 L/min/m2] 3.9 L/min/m2  Intake/Output from previous day: 06/29 0701 - 06/30 0700 In: 1504.6 [P.O.:360; I.V.:944.6; IV Piggyback:200] Out: 1940 [Urine:1280; Chest Tube:660]  Intake/Output this shift: Total I/O In: 27 [I.V.:27] Out: 115 [Urine:85; Chest Tube:30]  Current Meds: Scheduled Meds: . acetaminophen  1,000 mg Oral Q6H   Or  . acetaminophen (TYLENOL) oral liquid 160 mg/5 mL  1,000 mg Per Tube Q6H  . aspirin EC  325 mg Oral Daily   Or  . aspirin  324 mg Per Tube Daily  . atorvastatin  80 mg Oral q1800  . bisacodyl  10 mg Oral Daily   Or  . bisacodyl  10 mg Rectal Daily  . docusate  sodium  200 mg Oral Daily  . enoxaparin (LOVENOX) injection  30 mg Subcutaneous QHS  . insulin aspart  0-24 Units Subcutaneous Q4H  . insulin detemir  15 Units Subcutaneous Daily  . metoCLOPramide (REGLAN) injection  10 mg Intravenous Q6H  . metoprolol tartrate  25 mg Oral BID   Or  . metoprolol tartrate  25 mg Per Tube BID  . nicotine  14 mg Transdermal Daily  . pantoprazole  40 mg Oral Daily  . sodium chloride flush  3 mL Intravenous Q12H   Continuous Infusions: . sodium chloride 10 mL/hr at 04/14/18 0700  . sodium chloride    . sodium chloride 20 mL/hr (04/14/18 2001)  . amiodarone 30 mg/hr (04/15/18 0800)  . dexmedetomidine (PRECEDEX) IV infusion Stopped (04/14/18 0600)  . lactated ringers    . lactated ringers 10 mL/hr at 04/14/18 0700  . lactated ringers 20 mL/hr at 04/14/18 0700  . milrinone 0.25 mcg/kg/min (04/15/18 0800)  . nitroGLYCERIN Stopped (04/13/18 1530)  . phenylephrine (NEO-SYNEPHRINE) Adult infusion Stopped (04/14/18 0126)   PRN Meds:.sodium chloride, lactated ringers, metoprolol tartrate, midazolam, morphine injection, ondansetron (ZOFRAN) IV, oxyCODONE, sodium chloride flush, traMADol  General appearance: alert, cooperative and no distress Neurologic: intact Heart: irregularly irregular rhythm Lungs: diminished  breath sounds bibasilar Abdomen: soft, non-tender; bowel sounds normal; no masses,  no organomegaly Extremities: extremities normal, atraumatic, no cyanosis or edema and Homans sign is negative, no sign of DVT Wound: Sternum stable  Lab Results: CBC: Recent Labs    04/14/18 1634 04/14/18 1704 04/15/18 0340  WBC 15.0*  --  17.5*  HGB 8.2* 8.8* 7.9*  HCT 27.3* 26.0* 26.6*  PLT 160  --  186   BMET:  Recent Labs    04/14/18 0400  04/14/18 1704 04/15/18 0340  NA 138  --  134* 132*  K 4.5  --  4.6 4.8  CL 107  --  98 99  CO2 25  --   --  26  GLUCOSE 125*  --  266* 170*  BUN 17  --  21* 23*  CREATININE 0.92   < > 1.40* 1.60*  CALCIUM  7.7*  --   --  7.7*   < > = values in this interval not displayed.    CMET: Lab Results  Component Value Date   WBC 17.5 (H) 04/15/2018   HGB 7.9 (L) 04/15/2018   HCT 26.6 (L) 04/15/2018   PLT 186 04/15/2018   GLUCOSE 170 (H) 04/15/2018   ALT 29 04/14/2018   AST 47 (H) 04/14/2018   NA 132 (L) 04/15/2018   K 4.8 04/15/2018   CL 99 04/15/2018   CREATININE 1.60 (H) 04/15/2018   BUN 23 (H) 04/15/2018   CO2 26 04/15/2018   TSH 12.629 (H) 04/14/2018   INR 1.10 04/11/2018   HGBA1C 6.2 (H) 04/11/2018      PT/INR: No results for input(s): LABPROT, INR in the last 72 hours. Radiology: Dg Chest Port 1 View  Result Date: 04/15/2018 CLINICAL DATA:  Post aortic valve replacement. EXAM: PORTABLE CHEST 1 VIEW COMPARISON:  04/14/2018 FINDINGS: Stable changes of recent aortic valve replacement. Left chest tube in place. Enlarged cardiac silhouette.  Mediastinal contours appear intact. No radiographically apparent pneumothorax. Left lower lobe atelectasis and/or left pleural effusion. Osseous structures are without acute abnormality. Soft tissues are grossly normal. IMPRESSION: Enlarged cardiac silhouette. Persistent left lower lobe atelectasis and/or pleural effusion. Stable position of left chest tube. Electronically Signed   By: Fidela Salisbury M.D.   On: 04/15/2018 08:33   Acute Kidney Injury (any one)  Increase in SCr by > 0.3 within 48 hours  Increase SCr to > 1.5 times baseline  Urine volume < 0.5 ml/kg/h for 6 hrs  ?Stage 1 - Increase in serum creatinine to 1.5 to 1.9 times baseline, or increase in serum creatinine by ?0.3 mg/dL (?26.5 micromol/L), or reduction in urine output to <0.5 mL/kg per hour for 6 to 12 hours.  ?Stage 2 - Increase in serum creatinine to 2.0 to 2.9 times baseline, or reduction in urine output to <0.5 mL/kg per hour for ?12 hours.  ?Stage 3 - Increase in serum creatinine to 3.0 times baseline, or increase in serum creatinine to ?4.0 mg/dL (?353.6 micromol/L),  or reduction in urine output to <0.3 mL/kg per hour for ?24 hours, or anuria for ?12 hours, or the initiation of renal replacement therapy, or, in patients <18 years, decrease in eGFR to <35 mL   Lab Results  Component Value Date   CREATININE 1.60 (H) 04/15/2018   Estimated Creatinine Clearance: 74 mL/min (A) (by C-G formula based on SCr of 1.6 mg/dL (H)).   Assessment/Plan: S/P Procedure(s) (LRB): AORTIC VALVE REPLACEMENT (AVR) (N/A) CORONARY ARTERY BYPASS GRAFTING (CABG) TIMES ONE USING LEFT INTERNAL  MAMMARY ARTERY (N/A) TRANSESOPHAGEAL ECHOCARDIOGRAM (TEE) (N/A) Mobilize Diuresis d/c tubes/lines Leave Foley to monitor urine output Still with bursts of atrial fibrillation continue IV amiodarone, watch for evidence of heart block with recent aortic valve replacement. TSH elevated, monitor closely especially with amiodarone usage Creatinine elevated 1.6, 0.9 preop-stage I acute kidney injury Expected blood loss anemia  Grace Isaac 04/15/2018 8:37 AM

## 2018-04-15 NOTE — Progress Notes (Signed)
Rad/MD called PCXR results, [facility] will [facility] to monitor

## 2018-04-15 NOTE — Progress Notes (Signed)
Pt with possible signs of anxiety and request medication to help sleep and/or for nerves

## 2018-04-15 NOTE — Progress Notes (Signed)
Dr. Tyrone SageGerhardt at bedside and notified of sob, tacypneic, and sats down to 80% at times.  Orders received will continue to monitor closely.

## 2018-04-15 NOTE — Progress Notes (Signed)
Patient ID: [name], male   DOB: 02/03/1956, 62 y.o.   MRN: [id number] EVENING ROUNDS NOTE :     [address]       [place] [id number]             [phone]                 2 Days Post-Op Procedure(s) (LRB): AORTIC VALVE REPLACEMENT (AVR) (N/A) CORONARY ARTERY BYPASS GRAFTING (CABG) TIMES ONE USING LEFT INTERNAL MAMMARY ARTERY (N/A) TRANSESOPHAGEAL ECHOCARDIOGRAM (TEE) (N/A)  Total Length of Stay:  LOS: 2 days  BP (!) 142/67   Pulse 64   Temp 98.7 F (37.1 C) (Oral)   Resp (!) 25   Ht 6\' 2"  (1.88 m)   Wt (!) 315 lb 14.7 oz (143.3 kg)   SpO2 100%   BMI 40.56 kg/m   .Intake/Output      06/29 0701 - 06/30 0700 06/30 0701 - 07/01 0700   P.O. 360 360   I.V. (mL/kg) 944.6 (6.6) 209.6 (1.5)   Blood     NG/GT     IV Piggyback 200    Total Intake(mL/kg) 1504.6 (10.5) 569.6 (4)   Urine (mL/kg/hr) 1280 (0.4) 950 (0.6)   Blood     Chest Tube 660 110   Total Output 1940 1060   Net -435.4 -490.4          . sodium chloride 10 mL/hr at 04/14/18 0700  . sodium chloride    . sodium chloride 20 mL/hr (04/14/18 2001)  . amiodarone 30 mg/hr (04/15/18 1600)  . dexmedetomidine (PRECEDEX) IV infusion Stopped (04/14/18 0600)  . lactated ringers    . lactated ringers 10 mL/hr at 04/14/18 0700  . lactated ringers 20 mL/hr at 04/14/18 0700  . milrinone 0.25 mcg/kg/min (04/15/18 1600)  . nitroGLYCERIN Stopped (04/13/18 1530)  . phenylephrine (NEO-SYNEPHRINE) Adult infusion Stopped (04/14/18 0126)     Lab Results  Component Value Date   WBC 17.5 (H) 04/15/2018   HGB 7.9 (L) 04/15/2018   HCT 26.6 (L) 04/15/2018   PLT 186 04/15/2018   GLUCOSE 170 (H) 04/15/2018   ALT 29 04/14/2018   AST 47 (H) 04/14/2018   NA 132 (L) 04/15/2018   K 4.8 04/15/2018   CL 99 04/15/2018   CREATININE 1.60 (H) 04/15/2018   BUN 23 (H) 04/15/2018   CO2 26 04/15/2018   TSH 12.629 (H) 04/14/2018   INR 1.10 04/11/2018   HGBA1C 6.2 (H) 04/11/2018   More sob after walk Af  106-120 Not wheezing on exam but tight uop responded to lasix this am 800 past 10 hours  Additional lasix, chest xray now Evening labs pending    Delight Ovensdward B Nasim Garofano MD  Beeper [phone] Office [phone] 04/15/2018 6:50 PM

## 2018-04-16 ENCOUNTER — Inpatient Hospital Stay (HOSPITAL_COMMUNITY): Payer: 59

## 2018-04-16 ENCOUNTER — Encounter (HOSPITAL_COMMUNITY): Payer: Self-pay | Admitting: Cardiothoracic Surgery

## 2018-04-16 LAB — BASIC METABOLIC PANEL
Anion gap: 9 (ref 5–15)
BUN: 22 mg/dL — ABNORMAL HIGH (ref 6–20)
CO2: 27 mmol/L (ref 22–32)
Calcium: 7.8 mg/dL — ABNORMAL LOW (ref 8.9–10.3)
Chloride: 97 mmol/L — ABNORMAL LOW (ref 98–111)
Creatinine, Ser: 1.01 mg/dL (ref 0.61–1.24)
GFR calc Af Amer: >60 mL/min (ref >60)
GFR calc non Af Amer: >60 mL/min (ref >60)
Glucose, Bld: 160 mg/dL — ABNORMAL HIGH (ref 70–99)
Potassium: 4.2 mmol/L (ref 3.5–5.1)
Sodium: 133 mmol/L — ABNORMAL LOW (ref 135–145)

## 2018-04-16 LAB — POCT I-STAT 4, (NA,K, GLUC, HGB,HCT)
Glucose, Bld: 125 mg/dL — ABNORMAL HIGH (ref 70–99)
HCT: 24 % — ABNORMAL LOW (ref 39.0–52.0)
Hemoglobin: 8.2 g/dL — ABNORMAL LOW (ref 13.0–17.0)
Potassium: 3.9 mmol/L (ref 3.5–5.1)
Sodium: 141 mmol/L (ref 135–145)

## 2018-04-16 LAB — CBC
HCT: 23.8 % — ABNORMAL LOW (ref 39.0–52.0)
Hemoglobin: 7.2 g/dL — ABNORMAL LOW (ref 13.0–17.0)
MCH: 25.8 pg — ABNORMAL LOW (ref 26.0–34.0)
MCHC: 30.3 g/dL (ref 30.0–36.0)
MCV: 85.3 fL (ref 78.0–100.0)
Platelets: 144 10*3/uL — ABNORMAL LOW (ref 150–400)
RBC: 2.79 MIL/uL — ABNORMAL LOW (ref 4.22–5.81)
RDW: 15.4 % (ref 11.5–15.5)
WBC: 10.9 10*3/uL — ABNORMAL HIGH (ref 4.0–10.5)

## 2018-04-16 LAB — POCT I-STAT 3, ART BLOOD GAS (G3+)
Acid-base deficit: 1 mmol/L (ref 0.0–2.0)
Bicarbonate: 24.6 mmol/L (ref 20.0–28.0)
O2 Saturation: 98 %
TCO2: 26 mmol/L (ref 22–32)
pCO2 arterial: 41.5 mmHg (ref 32.0–48.0)
pH, Arterial: 7.38 (ref 7.350–7.450)
pO2, Arterial: 104 mmHg (ref 83.0–108.0)

## 2018-04-16 LAB — GLUCOSE, CAPILLARY
Glucose-Capillary: 107 mg/dL — ABNORMAL HIGH (ref 70–99)
Glucose-Capillary: 138 mg/dL — ABNORMAL HIGH (ref 70–99)
Glucose-Capillary: 150 mg/dL — ABNORMAL HIGH (ref 70–99)
Glucose-Capillary: 201 mg/dL — ABNORMAL HIGH (ref 70–99)
Glucose-Capillary: 203 mg/dL — ABNORMAL HIGH (ref 70–99)
Glucose-Capillary: 141 mg/dL — ABNORMAL HIGH (ref 70–99)

## 2018-04-16 MED ORDER — FUROSEMIDE 10 MG/ML IJ SOLN
80.0000 mg | Freq: Every day | INTRAMUSCULAR | Status: DC
  Administered 2018-04-16 – 2018-04-17 (×2): 80 mg via INTRAVENOUS
  Filled 2018-04-16 (×2): qty 8

## 2018-04-16 MED ORDER — LEVALBUTEROL HCL 0.63 MG/3ML IN NEBU
0.6300 mg | INHALATION_SOLUTION | Freq: Once | RESPIRATORY_TRACT | Status: AC
  Administered 2018-04-16: 0.63 mg via RESPIRATORY_TRACT

## 2018-04-16 MED ORDER — FUROSEMIDE 10 MG/ML IJ SOLN
40.0000 mg | Freq: Once | INTRAMUSCULAR | Status: AC
Start: 2018-04-16 — End: 2018-04-16
  Administered 2018-04-16: 40 mg via INTRAVENOUS
  Filled 2018-04-16: qty 4

## 2018-04-16 MED ORDER — AMIODARONE LOAD VIA INFUSION
150.0000 mg | Freq: Once | INTRAVENOUS | Status: DC
Start: 2018-04-16 — End: 2018-04-16
  Filled 2018-04-16: qty 83.34

## 2018-04-16 MED ORDER — ALPRAZOLAM 0.5 MG PO TABS
1.0000 mg | ORAL_TABLET | Freq: Two times a day (BID) | ORAL | Status: DC | PRN
  Administered 2018-04-16 – 2018-04-21 (×7): 1 mg via ORAL
  Filled 2018-04-16 (×7): qty 2

## 2018-04-16 MED ORDER — AMIODARONE LOAD VIA INFUSION
150.0000 mg | Freq: Once | INTRAVENOUS | Status: AC
  Administered 2018-04-16: 150 mg via INTRAVENOUS
  Filled 2018-04-16: qty 83.34

## 2018-04-16 MED ORDER — CLONAZEPAM 1 MG PO TABS
1.5000 mg | ORAL_TABLET | Freq: Two times a day (BID) | ORAL | Status: DC
  Administered 2018-04-16: 1.5 mg via ORAL
  Filled 2018-04-16: qty 1

## 2018-04-16 MED ORDER — DILTIAZEM HCL-DEXTROSE 100-5 MG/100ML-% IV SOLN (PREMIX)
10.0000 mg/h | INTRAVENOUS | Status: DC
  Administered 2018-04-16 (×3): 10 mg/h via INTRAVENOUS
  Filled 2018-04-16 (×4): qty 100

## 2018-04-16 MED ORDER — LEVALBUTEROL HCL 0.63 MG/3ML IN NEBU
0.6300 mg | INHALATION_SOLUTION | Freq: Three times a day (TID) | RESPIRATORY_TRACT | Status: DC
  Administered 2018-04-16 – 2018-04-18 (×7): 0.63 mg via RESPIRATORY_TRACT
  Filled 2018-04-16 (×8): qty 3

## 2018-04-16 MED ORDER — MORPHINE SULFATE (PF) 2 MG/ML IV SOLN: 2.0000 mg | INTRAVENOUS | Status: DC | PRN

## 2018-04-16 MED ORDER — MILRINONE LACTATE IN DEXTROSE 20-5 MG/100ML-% IV SOLN
0.2500 ug/kg/min | INTRAVENOUS | Status: DC
  Administered 2018-04-16 (×3): 0.25 ug/kg/min via INTRAVENOUS
  Filled 2018-04-16 (×3): qty 100

## 2018-04-16 MED ORDER — AMIODARONE IV BOLUS ONLY 150 MG/100ML
150.0000 mg | Freq: Once | INTRAVENOUS | Status: DC
Start: 2018-04-16 — End: 2018-04-16

## 2018-04-16 MED ORDER — FE FUMARATE-B12-VIT C-FA-IFC PO CAPS
1.0000 | ORAL_CAPSULE | Freq: Three times a day (TID) | ORAL | Status: DC
  Administered 2018-04-16 – 2018-04-22 (×19): 1 via ORAL
  Filled 2018-04-16 (×19): qty 1

## 2018-04-16 MED ORDER — ENOXAPARIN SODIUM 40 MG/0.4ML ~~LOC~~ SOLN
40.0000 mg | Freq: Every day | SUBCUTANEOUS | Status: DC
  Administered 2018-04-16 – 2018-04-21 (×6): 40 mg via SUBCUTANEOUS
  Filled 2018-04-16 (×6): qty 0.4

## 2018-04-16 MED ORDER — AMIODARONE IV BOLUS ONLY 150 MG/100ML: 150.0000 mg | Freq: Once | INTRAVENOUS | Status: DC

## 2018-04-16 MED ORDER — CLONAZEPAM 0.5 MG PO TABS: 1.5000 mg | ORAL_TABLET | Freq: Every day | ORAL | Status: DC

## 2018-04-16 NOTE — Plan of Care (Signed)
  Problem: Activity: Goal: Risk for activity intolerance will decrease Outcome: Progressing   Problem: Nutrition: Goal: Adequate nutrition will be maintained Outcome: Progressing   Problem: Safety: Goal: Ability to remain free from injury will improve Outcome: Progressing   Problem: Skin Integrity: Goal: Risk for impaired skin integrity will decrease Outcome: Progressing   

## 2018-04-16 NOTE — Progress Notes (Signed)
Patient needs much encouragement to perform breathing exercises correctly and consistently.  Verbalizes that he is unable to do better that he can do all that he can do.  Patient performs ISP poorly.  Have made multiple attempts to encourage correct performance with same response given as previously noted.  Spoke with RRT who also observed patient's performance and will also encourage patient to initial usage of ISP.  Monitoring continues.

## 2018-04-16 NOTE — Progress Notes (Addendum)
TCTS BRIEF SICU PROGRESS NOTE  3 Days Post-Op  S/P Procedure(s) (LRB): AORTIC VALVE REPLACEMENT (AVR) (N/A) CORONARY ARTERY BYPASS GRAFTING (CABG) TIMES ONE USING LEFT INTERNAL MAMMARY ARTERY (N/A) TRANSESOPHAGEAL ECHOCARDIOGRAM (TEE) (N/A)   Stable day In and out of Afib w/ controlled rate, stable BP Breathing comfortably on 4 L/min via  Diuresing well  Plan: Continue current plan  Purcell Nailslarence H Owen, MD 04/16/2018 7:01 PM

## 2018-04-16 NOTE — Progress Notes (Signed)
3 Days Post-Op Procedure(s) (LRB): AORTIC VALVE REPLACEMENT (AVR) (N/A) CORONARY ARTERY BYPASS GRAFTING (CABG) TIMES ONE USING LEFT INTERNAL MAMMARY ARTERY (N/A) TRANSESOPHAGEAL ECHOCARDIOGRAM (TEE) (N/A) Subjective: Postop afib- add iv cardizem to amio Postop low O2 sats- low lung volumes  - mobilize, diurese Postop and preop anemia on po Iron Objective: Vital signs in last 24 hours: Temp:  [98.4 F (36.9 C)-99.4 F (37.4 C)] 98.5 F (36.9 C) (07/01 0731) Pulse Rate:  [58-137] 72 (07/01 0700) Cardiac Rhythm: Atrial fibrillation;Atrial flutter;Sinus tachycardia (06/30 1950) Resp:  [12-35] 20 (07/01 0700) BP: (125-188)/(58-92) 142/63 (07/01 0700) SpO2:  [87 %-100 %] 99 % (07/01 0727) FiO2 (%):  [40 %-50 %] 40 % (07/01 0727) Weight:  [322 lb 15.6 oz (146.5 kg)] 322 lb 15.6 oz (146.5 kg) (07/01 0400)  Hemodynamic parameters for last 24 hours:   afib Intake/Output from previous day: 06/30 0701 - 07/01 0700 In: 2297.8 [P.O.:360; I.V.:1937.8] Out: 2861 [Urine:2751; Chest Tube:110] Intake/Output this shift: No intake/output data recorded.       Exam    General- alert and comfortable    Neck- no JVD, no cervical adenopathy palpable, no carotid bruit   Lungs- diminished with scattered ronchi   Cor- irregular rate and rhythm, no murmur , gallop   Abdomen- soft, non-tender   Extremities - warm, non-tender, minimal edema   Neuro- oriented, appropriate, no focal weakness   Lab Results: Recent Labs    04/15/18 0340 04/16/18 0443  WBC 17.5* 10.9*  HGB 7.9* 7.2*  HCT 26.6* 23.8*  PLT 186 144*   BMET:  Recent Labs    04/15/18 1852 04/16/18 0443  NA 128* 133*  K 4.7 4.2  CL 94* 97*  CO2 27 27  GLUCOSE 303* 160*  BUN 27* 22*  CREATININE 1.50* 1.01  CALCIUM 7.7* 7.8*    PT/INR: No results for input(s): LABPROT, INR in the last 72 hours. ABG    Component Value Date/Time   PHART 7.363 04/13/2018 1856   HCO3 27.0 04/13/2018 1856   TCO2 24 04/14/2018 1704   ACIDBASEDEF 2.0 04/13/2018 1740   O2SAT 99.0 04/13/2018 1856   CBG (last 3)  Recent Labs    04/15/18 2337 04/16/18 0335 04/16/18 0729  GLUCAP 130* 138* 150*    Assessment/Plan: S/P Procedure(s) (LRB): AORTIC VALVE REPLACEMENT (AVR) (N/A) CORONARY ARTERY BYPASS GRAFTING (CABG) TIMES ONE USING LEFT INTERNAL MAMMARY ARTERY (N/A) TRANSESOPHAGEAL ECHOCARDIOGRAM (TEE) (N/A) Mobilize Diuresis Diabetes control eliquis at DC  lovenox in hospital   LOS: 3 days    [name] 04/16/2018

## 2018-04-16 NOTE — Progress Notes (Signed)
Inpatient Diabetes Program Recommendations  AACE/ADA: New Consensus Statement on Inpatient Glycemic Control (2015)  Target Ranges:  Prepandial:   less than 140 mg/dL      Peak postprandial:   less than 180 mg/dL (1-2 hours)      Critically ill patients:  140 - 180 mg/dL   Lab Results  Component Value Date   GLUCAP 201 (H) 04/16/2018   HGBA1C 6.2 (H) 04/11/2018    Review of Glycemic Control Results for Kyle Weaver, Kyle J JR. "JACK" (MRN [id number]) as of 04/16/2018 12:16  Ref. Range 04/15/2018 23:37 04/16/2018 03:35 04/16/2018 07:29 04/16/2018 11:29  Glucose-Capillary Latest Ref Range: 70 - 99 mg/dL 409130 (H) 811138 (H) 914150 (H) 201 (H)   Diabetes history: none Outpatient Diabetes medications: none Current orders for Inpatient glycemic control: Novolog 0-24 units Q4H, Levemir 15 units QD  Inpatient Diabetes Program Recommendations:    Would recommend adding Novolog 4 units TID (assuming that patient consumes >50% of meal).   Thanks, Kyle RaveLauren Tesean Stump, MSN, RNC-OB Diabetes Coordinator [phone] (8a-5p)

## 2018-04-17 ENCOUNTER — Inpatient Hospital Stay (HOSPITAL_COMMUNITY): Payer: 59

## 2018-04-17 DIAGNOSIS — I351 Nonrheumatic aortic (valve) insufficiency: Secondary | ICD-10-CM

## 2018-04-17 DIAGNOSIS — Z952 Presence of prosthetic heart valve: Secondary | ICD-10-CM

## 2018-04-17 DIAGNOSIS — Z951 Presence of aortocoronary bypass graft: Secondary | ICD-10-CM

## 2018-04-17 LAB — GLUCOSE, CAPILLARY
Glucose-Capillary: 195 mg/dL — ABNORMAL HIGH (ref 70–99)
Glucose-Capillary: 150 mg/dL — ABNORMAL HIGH (ref 70–99)
Glucose-Capillary: 161 mg/dL — ABNORMAL HIGH (ref 70–99)
Glucose-Capillary: 153 mg/dL — ABNORMAL HIGH (ref 70–99)
Glucose-Capillary: 166 mg/dL — ABNORMAL HIGH (ref 70–99)
Glucose-Capillary: 104 mg/dL — ABNORMAL HIGH (ref 70–99)

## 2018-04-17 LAB — BASIC METABOLIC PANEL
Anion gap: 4 — ABNORMAL LOW (ref 5–15)
BUN: 23 mg/dL — ABNORMAL HIGH (ref 6–20)
CO2: 30 mmol/L (ref 22–32)
Calcium: 7.8 mg/dL — ABNORMAL LOW (ref 8.9–10.3)
Chloride: 98 mmol/L (ref 98–111)
Creatinine, Ser: 0.97 mg/dL (ref 0.61–1.24)
GFR calc Af Amer: >60 mL/min (ref >60)
GFR calc non Af Amer: >60 mL/min (ref >60)
Glucose, Bld: 160 mg/dL — ABNORMAL HIGH (ref 70–99)
Potassium: 4.2 mmol/L (ref 3.5–5.1)
Sodium: 132 mmol/L — ABNORMAL LOW (ref 135–145)

## 2018-04-17 LAB — CBC
HCT: 24 % — ABNORMAL LOW (ref 39.0–52.0)
Hemoglobin: 7.2 g/dL — ABNORMAL LOW (ref 13.0–17.0)
MCH: 25.5 pg — ABNORMAL LOW (ref 26.0–34.0)
MCHC: 30 g/dL (ref 30.0–36.0)
MCV: 85.1 fL (ref 78.0–100.0)
Platelets: 190 10*3/uL (ref 150–400)
RBC: 2.82 MIL/uL — ABNORMAL LOW (ref 4.22–5.81)
RDW: 15.8 % — ABNORMAL HIGH (ref 11.5–15.5)
WBC: 10.8 10*3/uL — ABNORMAL HIGH (ref 4.0–10.5)

## 2018-04-17 MED ORDER — POTASSIUM CHLORIDE CRYS ER 20 MEQ PO TBCR
20.0000 meq | EXTENDED_RELEASE_TABLET | Freq: Every day | ORAL | Status: DC
  Administered 2018-04-17: 20 meq via ORAL
  Filled 2018-04-17: qty 1

## 2018-04-17 MED ORDER — PERFLUTREN LIPID MICROSPHERE
INTRAVENOUS | Status: AC
  Administered 2018-04-17: 4 mg
  Filled 2018-04-17: qty 10

## 2018-04-17 MED ORDER — PERFLUTREN LIPID MICROSPHERE
INTRAVENOUS | Status: AC
  Filled 2018-04-17: qty 10

## 2018-04-17 MED FILL — Heparin Sodium (Porcine) Inj 1000 Unit/ML: INTRAMUSCULAR | Qty: 30 | Status: AC

## 2018-04-17 MED FILL — Heparin Sodium (Porcine) Inj 1000 Unit/ML: INTRAMUSCULAR | Qty: 20 | Status: AC

## 2018-04-17 MED FILL — Mannitol IV Soln 20%: INTRAVENOUS | Qty: 500 | Status: AC

## 2018-04-17 MED FILL — Lidocaine HCl(Cardiac) IV PF Soln Pref Syr 100 MG/5ML (2%): INTRAVENOUS | Qty: 5 | Status: AC

## 2018-04-17 MED FILL — Sodium Chloride IV Soln 0.9%: INTRAVENOUS | Qty: 2000 | Status: AC

## 2018-04-17 MED FILL — Potassium Chloride Inj 2 mEq/ML: INTRAVENOUS | Qty: 40 | Status: AC

## 2018-04-17 MED FILL — Electrolyte-R (PH 7.4) Solution: INTRAVENOUS | Qty: 4000 | Status: AC

## 2018-04-17 MED FILL — Perflutren Lipid Microsphere IV Susp 6.52 MG/ML: INTRAVENOUS | Qty: 2 | Status: AC

## 2018-04-17 MED FILL — Magnesium Sulfate Inj 50%: INTRAMUSCULAR | Qty: 10 | Status: AC

## 2018-04-17 MED FILL — Sodium Bicarbonate IV Soln 8.4%: INTRAVENOUS | Qty: 50 | Status: AC

## 2018-04-17 NOTE — Discharge Summary (Addendum)
Physician Discharge Summary  Patient ID: Elsie StainJacky J Sliney Jr. MRN: [id number] DOB/AGE: 03/26/1956 62 y.o.  Admit date: 04/13/2018 Discharge date: 04/22/2018  Admission Diagnoses:  Patient Active Problem List   Diagnosis Date Noted  . Aortic stenosis due to bicuspid aortic valve 04/13/2018  . Hypertension 04/04/2018  . Dyspnea 04/04/2018  . Nonrheumatic aortic valve stenosis 03/16/2018   Discharge Diagnoses:   Patient Active Problem List   Diagnosis Date Noted  . S/P AVR (aortic valve replacement) 04/17/2018  . S/P CABG x 1 04/17/2018  . Aortic stenosis due to bicuspid aortic valve 04/13/2018  . Hypertension 04/04/2018  . Dyspnea 04/04/2018  . Nonrheumatic aortic valve stenosis 03/16/2018  PAF  Discharged Condition: Stable  History of Present Illness:  Mr. Domenick Bookbinderlkes is a 62 yo morbidly obese white male referred to [facility] after recent diagnosis of severe Aortic Stenosis and single vessel CAD.  Approximately 6 months ago the patient developed symptoms of exertional shortness of breath.  The symptoms had gradually increased in frequency and intensity.  He also developed an episode of presyncope and PND.  He denied chest pain during these episodes.  He presented for evaluation at a walk in clinic who gave the patient diuretics and oxygen therapy.  On exam they noted the patient had a murmur and referred the patient to Dr. Deno Etiennehu for evaluation.  Workup included Echocardiogram which revealed a probable calcified bicuspid aortic valve with severe stenosis.  Cardiac catheterization showed single vessel CAD of the LAD.  It was felt surgical intervention would be indicated and he was referred to Dr. Donata ClayVan Trigt for evaluation.  During this time patient re-iterated the above mentioned symptoms.  He admitted to having no dental issues and gets his teeth cleaned regularly.  It was felt the patient should undergo coronary bypass grafting and aortic valve replacement.  The risks and  benefits of the procedure were explained to the patient and he was agreeable to proceed.        Hospital Course:   Mr. Domenick Bookbinderlkes [facility].  He was taken to the operating room and underwent CABG x 1 utilizing LIMA to LAD and Aortic Valve Replacement with a 23 mm Edwards Intuity Valve.  He tolerated the procedure without difficulty and was taken to the SICU in stable condition.  The patient was extubated the evening of surgery.  During his stay in the SICU the patient was weaned off Milrinone as tolerated.  He developed Atrial Fibrillation and was treated with IV Amiodarone.  He did not convert initially with this and required addition of Cardizem drip.  He later converted to NSR.  He was transitioned to oral regimen of Amiodarone.  His chest tubes and arterial lines were removed without difficulty.  He was diuresed with Lasix for hypervolemia.  He was ambulating in the SICU and was transferred to the stepdown unit on 04/18/2018.  The patient continued to make progress.  He was maintaining NSR with PVCs.  He was hypertensive and his Lopressor and Cozaar dose were increased.  His weight continued to improve and he was transitioned to an oral regimen of diuretics.  He was scheduled for a thoracentesis on 07/05;however, no procedure was done as there was not enough pleural fluid. He was on 1 liter of oxygen as of 07/06.  Amiodarone was decreased to 200 mg po bid on 07/07 as he had HR intermittently decreased into the 50's. Per Dr. Donata ClayVan Trigt, he was started  on Eliquis 5 mg bid at discharge and ecasa was decreased to 81 mg bid. He had issues with constipation which resolved prior to discharge.  He was ambulating with assistance of a rolling walker.  His incisions are healing without evidence on infection.  He is tolerating a heart healthy diet.  He is medically stable for discharge home today.      Treatments: surgery:   1.  Aortic valve replacement with a 23 mm Edwards tissue valve  Inspiris, serial #[id number]. 2.  Coronary artery bypass graft x1 using left internal mammary artery to left anterior descending.  Discharge Exam: Blood pressure (!) 118/56, pulse (!) 59, temperature 98.1 F (36.7 C), temperature source Oral, resp. rate 18, height 6\' 2"  (1.88 m), weight 299 lb 2.6 oz (135.7 kg), SpO2 95 %. Cardiovascular: RRR, murmur Pulmonary: Slightly diminished at bases Abdomen: Soft, non tender, bowel sounds present. Extremities: Mild bilateral lower extremity edema. Wounds: Clean and dry.  No erythema or signs of infection.  Disposition:  Stable and discharged to home.  Discharge medications:    Allergies as of 04/22/2018   No Known Allergies     Medication List    STOP taking these medications   hydrochlorothiazide 25 MG tablet Commonly known as:  HYDRODIURIL     TAKE these medications   amiodarone 200 MG tablet Commonly known as:  PACERONE Take 1 tablet (200 mg total) by mouth 2 (two) times daily. For one week;then take Amiodarone 200 mg by mouth daily thereafter   apixaban 5 MG Tabs tablet Commonly known as:  ELIQUIS Take 1 tablet (5 mg total) by mouth 2 (two) times daily.   aspirin EC 81 MG tablet Take 81 mg by mouth daily.   atorvastatin 80 MG tablet Commonly known as:  LIPITOR Take 80 mg by mouth daily at 6 PM.   CITRUCEL 500 MG Tabs Generic drug:  Methylcellulose (Laxative) Take 1,000 mg by mouth daily.   COSAMIN DS PO Take 2 tablets by mouth.   furosemide 40 MG tablet Commonly known as:  LASIX Take 1 tablet (40 mg total) by mouth daily. For one week then stop.   loratadine 10 MG tablet Commonly known as:  CLARITIN Take 10 mg by mouth daily as needed for allergies (for sinuses.).   losartan 50 MG tablet Commonly known as:  COZAAR Take 1 tablet (50 mg total) by mouth daily.   metoprolol succinate 50 MG 24 hr tablet Commonly known as:  TOPROL-XL Take 1 tablet (50 mg total) by mouth daily. What changed:    medication  strength  how much to take   multivitamin with minerals Tabs tablet Take 1 tablet by mouth daily. Men's multivitamin   nicotine 14 mg/24hr patch Commonly known as:  NICODERM CQ - dosed in mg/24 hours Place 1 patch (14 mg total) onto the skin daily.   OMEGA-3 FISH OIL-VITAMIN D3 PO Take 1 tablet by mouth daily. 1200 FISH OIL& 660 OMEGA-3 - 2000 I.U. (D3)   oxyCODONE 5 MG immediate release tablet Commonly known as:  Oxy IR/ROXICODONE Take 5 mg by mouth every 4-6 hours PRN severe pain   potassium chloride SA 20 MEQ tablet Commonly known as:  K-DUR,KLOR-CON Take 1 tablet (20 mEq total) by mouth daily. For one week then stop.            Durable Medical Equipment  (From admission, onward)        Start     Ordered   04/21/18 0756  For home  use only DME Walker rolling  Once    Comments:  RW with 5" wheels  Question:  Patient needs a walker to treat with the following condition  Answer:  Physical deconditioning   04/21/18 0756   04/18/18 1647  For home use only DME 3 n 1  Once     04/18/18 1646   04/18/18 0933  For home use only DME Walker rolling  Once    Comments:  Bariatric patient  Question:  Patient needs a walker to treat with the following condition  Answer:  Physical deconditioning   04/18/18 0933    The patient has been discharged on:   1.Beta Blocker:  Yes [ x  ]                              No   [   ]                              If No, reason:  2.Ace Inhibitor/ARB: Yes [ x  ]                                     No  [    ]                                     If No, reason:  3.Statin:   Yes Arly.Keller   ]                  No  [   ]                  If No, reason:  4.Ecasa:  Yes  Arly.Keller   ]                  No   [   ]                  If No, reason:   Follow-up Information    Kerin Perna, MD Follow up on 05/16/2018.   Specialty:  Cardiothoracic Surgery Why:  Appointment is at 12:00, please get CXR at 11:30 at [facility] located on first floor of our  office building Contact information: [address] [place] [address] [phone]        Caryl Ada, MD Follow up.   Specialty:  Cardiology Why:  please contact at discharge to set up follow up appointment for 2 weeks. Contact information: [address] [place] [phone]        Medical Doctor Follow up.   Why:  Obtain a medical doctor for further surveillance of pre op HGA1C 6.2 (pre diabetes).          Signed: Ardelle Balls PA-C 04/22/2018, 9:16 AM   patient examined and medical record reviewed,agree with above note. Kathlee Nations Trigt III 04/29/2018

## 2018-04-17 NOTE — Progress Notes (Signed)
Patient ID: Kyle StainJacky J Abbett Jr., male   DOB: 01-24-1956, 62 y.o.   MRN: [id number] TCTS Evening Rounds:  Hemodynamically stable in sinus 63. cardizem and milrinone are off.  Excellent diuresis.   sats 99% 4L [place]  Waiting on bed on 4E.

## 2018-04-17 NOTE — Progress Notes (Addendum)
TCTS DAILY ICU PROGRESS NOTE                   [address]            [place] [id number]          [phone]   4 Days Post-Op Procedure(s) (LRB): AORTIC VALVE REPLACEMENT (AVR) (N/A) CORONARY ARTERY BYPASS GRAFTING (CABG) TIMES ONE USING LEFT INTERNAL MAMMARY ARTERY (N/A) TRANSESOPHAGEAL ECHOCARDIOGRAM (TEE) (N/A)  Total Length of Stay:  LOS: 4 days   Subjective:  No new complaints.  States he was already up walking around unit this morning.  + BM  Objective: Vital signs in last 24 hours: Temp:  [98 F (36.7 C)-99.4 F (37.4 C)] 99.4 F (37.4 C) (07/02 0335) Pulse Rate:  [62-147] 147 (07/02 0500) Cardiac Rhythm: Atrial fibrillation (07/01 2000) Resp:  [13-30] 14 (07/02 0300) BP: (111-165)/(49-118) 165/72 (07/02 0500) SpO2:  [91 %-99 %] 94 % (07/02 0500) FiO2 (%):  [40 %] 40 % (07/01 0951) Weight:  [310 lb 6.5 oz (140.8 kg)] 310 lb 6.5 oz (140.8 kg) (07/02 0449)  Filed Weights   04/15/18 0500 04/16/18 0400 04/17/18 0449  Weight: (!) 315 lb 14.7 oz (143.3 kg) (!) 322 lb 15.6 oz (146.5 kg) (!) 310 lb 6.5 oz (140.8 kg)    Weight change: -12 lb 9.1 oz (-5.7 kg)    Intake/Output from previous day: 07/01 0701 - 07/02 0700 In: 1372.4 [I.V.:1372.4] Out: 4290 [Urine:4290]  Current Meds: Scheduled Meds: . acetaminophen  1,000 mg Oral Q6H   Or  . acetaminophen (TYLENOL) oral liquid 160 mg/5 mL  1,000 mg Per Tube Q6H  . aspirin EC  325 mg Oral Daily   Or  . aspirin  324 mg Per Tube Daily  . atorvastatin  80 mg Oral q1800  . bisacodyl  10 mg Oral Daily   Or  . bisacodyl  10 mg Rectal Daily  . docusate sodium  200 mg Oral Daily  . enoxaparin (LOVENOX) injection  40 mg Subcutaneous QHS  . ferrous fumarate-b12-vitamic C-folic acid  1 capsule Oral TID PC  . furosemide  80 mg Intravenous Daily  . insulin aspart  0-24 Units Subcutaneous Q4H  . insulin detemir  15 Units Subcutaneous Daily  . levalbuterol  0.63 mg Nebulization TID  . mouth rinse  15 mL  Mouth Rinse BID  . metoCLOPramide (REGLAN) injection  10 mg Intravenous Q6H  . metoprolol tartrate  25 mg Oral BID   Or  . metoprolol tartrate  25 mg Per Tube BID  . nicotine  14 mg Transdermal Daily  . pantoprazole  40 mg Oral Daily  . sodium chloride flush  3 mL Intravenous Q12H   Continuous Infusions: . sodium chloride Stopped (04/15/18 1900)  . sodium chloride    . sodium chloride 20 mL/hr at 04/17/18 0500  . amiodarone 30 mg/hr (04/17/18 0524)  . diltiazem (CARDIZEM) infusion 10 mg/hr (04/17/18 0500)  . lactated ringers 33.3 mL/hr at 04/14/18 1908  . milrinone 0.25 mcg/kg/min (04/17/18 0500)  . nitroGLYCERIN Stopped (04/13/18 1530)  . phenylephrine (NEO-SYNEPHRINE) Adult infusion Stopped (04/14/18 0126)   PRN Meds:.sodium chloride, ALPRAZolam, metoprolol tartrate, morphine injection, ondansetron (ZOFRAN) IV, oxyCODONE, sodium chloride flush, traMADol  General appearance: alert, cooperative and no distress Heart: regular rate and rhythm Lungs: clear to auscultation bilaterally Abdomen: soft, non-tender; bowel sounds normal; no masses,  no organomegaly Extremities: edema trace Wound: clean and dry  Lab Results: CBC: Recent Labs  04/16/18 0443 04/17/18 0445  WBC 10.9* 10.8*  HGB 7.2* 7.2*  HCT 23.8* 24.0*  PLT 144* 190   BMET:  Recent Labs    04/16/18 0443 04/17/18 0445  NA 133* 132*  K 4.2 4.2  CL 97* 98  CO2 27 30  GLUCOSE 160* 160*  BUN 22* 23*  CREATININE 1.01 0.97  CALCIUM 7.8* 7.8*    CMET: Lab Results  Component Value Date   WBC 10.8 (H) 04/17/2018   HGB 7.2 (L) 04/17/2018   HCT 24.0 (L) 04/17/2018   PLT 190 04/17/2018   GLUCOSE 160 (H) 04/17/2018   ALT 29 04/14/2018   AST 47 (H) 04/14/2018   NA 132 (L) 04/17/2018   K 4.2 04/17/2018   CL 98 04/17/2018   CREATININE 0.97 04/17/2018   BUN 23 (H) 04/17/2018   CO2 30 04/17/2018   TSH 12.629 (H) 04/14/2018   INR 1.10 04/11/2018   HGBA1C 6.2 (H) 04/11/2018      PT/INR: No results for  input(s): LABPROT, INR in the last 72 hours. Radiology: No results found.   Assessment/Plan: S/P Procedure(s) (LRB): AORTIC VALVE REPLACEMENT (AVR) (N/A) CORONARY ARTERY BYPASS GRAFTING (CABG) TIMES ONE USING LEFT INTERNAL MAMMARY ARTERY (N/A) TRANSESOPHAGEAL ECHOCARDIOGRAM (TEE) (N/A)  1. CV- PAF, currently NSR with PVCs- on IV Amiodarone, IV Cardizem and Milrinone- can likely stop Milrinone, convert IV Amiodarone to oral regimen, and stop IV Cardizem will discuss with Dr. Donata Clay 2. Pulm- no acute issues, off oxygen, continue IS.Marland Kitchen Patient requires a lot of encouragement to use effectively 3. Renal- creatinine remains WNL, weight is trending down, on Lasix IV 80 mg daily, K is 4.2, will supplement to keep near 4 4. Expected post operative blood loss anemia, Hgb at 7.2, on iron supplementation, may benefit from a unit of blood which could help with HR control 5. CBGs mostly controlled, patient is prediabetic, will stop SSIP and he will require close follow up at discharge 6. Dispo- patient stable, appears to be in NSR with PVCs this morning, will discuss management of drips with Dr. Donata Clay, continue current care, possibly transfer to 4E stepdown later today  Erin Barrett 04/17/2018 7:17 AM   Maintaining NSR most of the day Transfer to stepdown ECHo shows AVR functioning- LV baseline EF.40  patient examined and medical record reviewed,agree with above note. Kathlee Nations Trigt III 04/17/2018

## 2018-04-17 NOTE — Plan of Care (Signed)
Patient alert and oriented.  Titrating drips as charted. Performs ISP exercises accurately, ranging from 550 to 750.  Minimal drainage from chest tubes noted.  Dangled patient on side of the bed, denied any dizziness or increased chest pain.  Monitoring/

## 2018-04-17 NOTE — Progress Notes (Signed)
  Echocardiogram 2D Echocardiogram has been performed.  Kyle ChimesWendy  Tahmir Weaver 04/17/2018, 11:39 AM

## 2018-04-17 NOTE — Evaluation (Signed)
Physical Therapy Evaluation Patient Details Name: Kyle Weaver. MRN: [id number] DOB: 1956/06/17 Today's Date: 04/17/2018   History of Present Illness  Patient with PMH of CAD, AS, DM, and HTN admitted for AVR and CABG x 1.  Clinical Impression  Patient presents with decreased independence with mobility due to weakness, sternal precautions, decreased balance and limited activity tolerance.  Able to ambulate in hallway this session with RW and min A and assisted back to bed with mod A.  Feel he will benefit from skilled PT in the acute setting to allow return home with follow up HHPT.    Follow Up Recommendations Home health PT    Equipment Recommendations  Rolling walker with 5" wheels(bariatric)    Recommendations for Other Services       Precautions / Restrictions Precautions Precautions: Sternal      Mobility  Bed Mobility Overal bed mobility: Needs Assistance Bed Mobility: Sit to Supine       Sit to supine: HOB elevated;Mod assist   General bed mobility comments: for legs onto bed, cues for technique  Transfers Overall transfer level: Needs assistance Equipment used: Rolling walker (2 wheeled) Transfers: Sit to/from [employer] Sit to Stand: Min assist Stand pivot transfers: Min assist       General transfer comment: hands on knees for sit to/from stand with minimal cues and assist for safety pt using momentum to stand; SPT to [facility] from recliner  Ambulation/Gait Ambulation/Gait assistance: Min guard Gait Distance (Feet): 150 Feet Assistive device: Rolling walker (2 wheeled) Gait Pattern/deviations: Step-through pattern;Decreased stride length;Wide base of support     General Gait Details: cues for posture, walker use  Stairs            Wheelchair Mobility    Modified Rankin (Stroke Patients Only)       Balance Overall balance assessment: Needs assistance   Sitting balance-Leahy Scale: Good       Standing balance-Leahy  Scale: Fair                               Pertinent Vitals/Pain Pain Assessment: Faces Faces Pain Scale: Hurts a little bit Pain Location: chest Pain Descriptors / Indicators: Sore Pain Intervention(s): Monitored during session;RN gave pain meds during session    Home Living Family/patient expects to be discharged to:: Private residence Living Arrangements: Spouse/significant other Available Help at Discharge: Family Type of Home: House Home Access: Stairs to enter   Secretary/administrator of Steps: 1 Home Layout: One level Home Equipment: Shower seat      Prior Function Level of Independence: Independent         Comments: works as Chief Operating Officer        Extremity/Trunk Assessment   Upper Extremity Assessment Upper Extremity Assessment: Overall WFL for tasks assessed    Lower Extremity Assessment Lower Extremity Assessment: Overall WFL for tasks assessed       Communication   Communication: No difficulties  Cognition Arousal/Alertness: Awake/alert Behavior During Therapy: WFL for tasks assessed/performed Overall Cognitive Status: Within Functional Limits for tasks assessed                                        General Comments      Exercises     Assessment/Plan    PT Assessment  Patient needs continued PT services  PT Problem List Decreased strength;Decreased mobility;Decreased activity tolerance;Decreased knowledge of use of DME;Decreased knowledge of precautions       PT Treatment Interventions DME instruction;Functional mobility training;Balance training;Patient/family education;Gait training;Therapeutic exercise;Therapeutic activities    PT Goals (Current goals can be found in the Care Plan section)  Acute Rehab PT Goals Patient Stated Goal: to return to independent PT Goal Formulation: With patient/family Time For Goal Achievement: 05/01/18 Potential to Achieve Goals: Good     Frequency Min 3X/week   Barriers to discharge        Co-evaluation               AM-PAC PT "6 Clicks" Daily Activity  Outcome Measure Difficulty turning over in bed (including adjusting bedclothes, sheets and blankets)?: A Lot Difficulty moving from lying on back to sitting on the side of the bed? : Unable Difficulty sitting down on and standing up from a chair with arms (e.g., wheelchair, bedside commode, etc,.)?: Unable Help needed moving to and from a bed to chair (including a wheelchair)?: A Little Help needed walking in hospital room?: A Little Help needed climbing 3-5 steps with a railing? : A Lot 6 Click Score: 12    End of Session Equipment Utilized During Treatment: Gait belt;Oxygen Activity Tolerance: Patient tolerated treatment well Patient left: in bed;with call bell/phone within reach;with family/visitor present Nurse Communication: Mobility status PT Visit Diagnosis: Unsteadiness on feet (R26.81);Other abnormalities of gait and mobility (R26.89)    Time: [id number] PT Time Calculation (min) (ACUTE ONLY): 52 min   Charges:   PT Evaluation $PT Eval Moderate Complexity: 1 Mod PT Treatments $Gait Training: 8-22 mins $Therapeutic Activity: 8-22 mins   PT G CodesSheran Lawless:        Cyndi Trindon Dorton, [place]PT [phone] 04/17/2018   Elray Mcgregorynthia Herschel Fleagle 04/17/2018, 2:15 PM

## 2018-04-18 ENCOUNTER — Inpatient Hospital Stay (HOSPITAL_COMMUNITY): Payer: 59

## 2018-04-18 LAB — GLUCOSE, CAPILLARY
Glucose-Capillary: 166 mg/dL — ABNORMAL HIGH (ref 70–99)
Glucose-Capillary: 153 mg/dL — ABNORMAL HIGH (ref 70–99)
Glucose-Capillary: 133 mg/dL — ABNORMAL HIGH (ref 70–99)

## 2018-04-18 LAB — BASIC METABOLIC PANEL
Anion gap: 7 (ref 5–15)
BUN: 24 mg/dL — AB (ref 6–20)
CALCIUM: 8.1 mg/dL — AB (ref 8.9–10.3)
CO2: 31 mmol/L (ref 22–32)
Chloride: 97 mmol/L — ABNORMAL LOW (ref 98–111)
Creatinine, Ser: 0.91 mg/dL (ref 0.61–1.24)
GFR calc Af Amer: >60 mL/min (ref >60)
GLUCOSE: 156 mg/dL — AB (ref 70–99)
Potassium: 3.8 mmol/L (ref 3.5–5.1)
Sodium: 135 mmol/L (ref 135–145)

## 2018-04-18 LAB — CBC
HCT: 24.4 % — ABNORMAL LOW (ref 39.0–52.0)
Hemoglobin: 7.3 g/dL — ABNORMAL LOW (ref 13.0–17.0)
MCH: 25.6 pg — ABNORMAL LOW (ref 26.0–34.0)
MCHC: 29.9 g/dL — ABNORMAL LOW (ref 30.0–36.0)
MCV: 85.6 fL (ref 78.0–100.0)
Platelets: 184 10*3/uL (ref 150–400)
RBC: 2.85 MIL/uL — ABNORMAL LOW (ref 4.22–5.81)
RDW: 15.8 % — ABNORMAL HIGH (ref 11.5–15.5)
WBC: 8.1 10*3/uL (ref 4.0–10.5)

## 2018-04-18 LAB — ECHOCARDIOGRAM COMPLETE
Height: 74 in
Weight: 4966.52 oz

## 2018-04-18 MED ORDER — SORBITOL 70 % SOLN
60.0000 mL | Freq: Once | Status: AC
  Administered 2018-04-19: 60 mL via ORAL
  Filled 2018-04-18 (×2): qty 60

## 2018-04-18 MED ORDER — INSULIN ASPART 100 UNIT/ML ~~LOC~~ SOLN: 0.0000 [IU] | Freq: Every day | SUBCUTANEOUS | Status: DC

## 2018-04-18 MED ORDER — AMIODARONE HCL 200 MG PO TABS
400.0000 mg | ORAL_TABLET | Freq: Two times a day (BID) | ORAL | Status: DC
Start: 2018-04-18 — End: 2018-04-21
  Administered 2018-04-18 – 2018-04-21 (×7): 400 mg via ORAL
  Filled 2018-04-18 (×7): qty 2

## 2018-04-18 MED ORDER — POTASSIUM CHLORIDE CRYS ER 20 MEQ PO TBCR
30.0000 meq | EXTENDED_RELEASE_TABLET | Freq: Two times a day (BID) | ORAL | Status: DC
  Administered 2018-04-18 – 2018-04-21 (×7): 30 meq via ORAL
  Filled 2018-04-18 (×6): qty 1

## 2018-04-18 MED ORDER — INSULIN ASPART 100 UNIT/ML ~~LOC~~ SOLN: 0.0000 [IU] | Freq: Three times a day (TID) | SUBCUTANEOUS | Status: DC

## 2018-04-18 MED ORDER — FUROSEMIDE 40 MG PO TABS
40.0000 mg | ORAL_TABLET | Freq: Two times a day (BID) | ORAL | Status: DC
  Administered 2018-04-18 (×2): 40 mg via ORAL
  Filled 2018-04-18 (×2): qty 1

## 2018-04-18 MED ORDER — FUROSEMIDE 10 MG/ML IJ SOLN
40.0000 mg | Freq: Every day | INTRAMUSCULAR | Status: DC
  Administered 2018-04-19 – 2018-04-20 (×2): 40 mg via INTRAVENOUS
  Filled 2018-04-18 (×3): qty 4

## 2018-04-18 MED ORDER — POLYETHYLENE GLYCOL 3350 17 G PO PACK
17.0000 g | PACK | Freq: Every day | ORAL | Status: DC
  Administered 2018-04-18 – 2018-04-22 (×5): 17 g via ORAL
  Filled 2018-04-18 (×5): qty 1

## 2018-04-18 MED ORDER — SORBITOL 70 % PO SOLN
60.0000 mL | Freq: Once | ORAL | Status: DC
  Filled 2018-04-18 (×2): qty 60

## 2018-04-18 MED ORDER — LEVALBUTEROL HCL 0.63 MG/3ML IN NEBU: 0.6300 mg | INHALATION_SOLUTION | Freq: Four times a day (QID) | RESPIRATORY_TRACT | Status: DC | PRN

## 2018-04-18 MED ORDER — POTASSIUM CHLORIDE CRYS ER 20 MEQ PO TBCR
20.0000 meq | EXTENDED_RELEASE_TABLET | Freq: Two times a day (BID) | ORAL | Status: DC
  Filled 2018-04-18: qty 1

## 2018-04-18 MED ORDER — LOSARTAN POTASSIUM 25 MG PO TABS
25.0000 mg | ORAL_TABLET | Freq: Every day | ORAL | Status: DC
  Administered 2018-04-18: 25 mg via ORAL
  Filled 2018-04-18: qty 1

## 2018-04-18 NOTE — Progress Notes (Signed)
Patient's cousin, Sharl MaMarty came out to the med room and advised this RN that "he has urinated on the floor."  Sharl MaMarty advised that patient "needs help getting cleaned up."  This RN advised that I would be in the room in 1 minute.  Upon entering room, patient was observed sitting on the toilet in the bathroom.  Urine was present on the floor in the bathroom and patient's brother was also at bedside and very angry stating "he shouldn't have had to wait. We rang the bell and no one came."  This RN assisted to clean up the floor and help the patient with peri-care with assistance of Lisette AbuKaylee, Charity fundraiserN and Barnes & NobleErin, Consulting civil engineerCharge RN.  Additional nurses came to the room due to cardiac monitor alarming.  Patient had removed himself from the monitor and removed nasal cannula.  Patient was assisted with peri-care and new gown and socks placed.  Patient assisted back to bed, gait steady and staff present for line safety.  Emotional support provided and patient and family reeducated on use of call bell.  Denny PeonErin, Consulting civil engineerCharge RN present and assisted with concerns from cousin and brother.  Patient stated "I'm fine, he gets worked up over a bad experience that happened with [facility] mother at [facility].

## 2018-04-18 NOTE — Progress Notes (Signed)
Patient resting comfortably on nasal cannula with no distress noted.  Bipap currently not indicated at this time.  Will continue to monitor and assess for bipap needs.

## 2018-04-18 NOTE — Progress Notes (Signed)
Patient arrived from 2 Heart. Patient is alert and oriented. Vital Signs are stable. Mid Sternal Incision is clean dry and intact. Patient is not complaining of any pain. He is resting comfortably in bed. He has been put on telemetry and CCMD has been notified. Will continue to monitor patient.

## 2018-04-18 NOTE — Care Management Note (Signed)
Case Management Note Donn PieriniKristi Riaan [place] RN,BSN [facility] 1-22 Case Manager  [phone]  Patient Details  Name: Kyle StainJacky J Fawbush Jr. MRN: [id number] Date of Birth: Nov 02, 1955  Subjective/Objective:   Pt admitted s/p AVR and CABGx1                 Action/Plan: PTA pt lived at home with wife and son, anticipate return home, orders have been placed for HHRN/PT and DME- RW and 3n1- spoke with pt at bedside who confirms he will have assistance from wife and family/friends on discharge. Pt has [employer] insurance which requires going through [facility] for any HH and DME needs- discussed this with pt and explained process. [facility] to [facility]- orders have been faxed to [facility] for approval and once approved [facility] and call pt with agency who will be providing services. CM will f/u with [facility] prior to discharge.   Expected Discharge Date:                  Expected Discharge Plan:  Home w Home Health Services  In-House Referral:  NA  Discharge planning Services  CM Consult  Post Acute Care Choice:  Durable Medical Equipment, Home Health Choice offered to:  Patient  DME Arranged:  Walker rolling, 3-N-1 DME Agency:     HH Arranged:  RN, PT HH Agency:     Status of Service:     If discussed at Long Length of Stay Meetings, dates discussed:    Discharge Disposition:   Additional Comments:  Darrold SpanWebster, Wandalee Klang Hall, RN 04/18/2018, 4:52 PM

## 2018-04-18 NOTE — Progress Notes (Addendum)
TCTS DAILY ICU PROGRESS NOTE                   [address]            [place] [address]          [phone]   5 Days Post-Op Procedure(s) (LRB): AORTIC VALVE REPLACEMENT (AVR) (N/A) CORONARY ARTERY BYPASS GRAFTING (CABG) TIMES ONE USING LEFT INTERNAL MAMMARY ARTERY (N/A) TRANSESOPHAGEAL ECHOCARDIOGRAM (TEE) (N/A)  Total Length of Stay:  LOS: 5 days   Subjective:  Patient is doing okay.  He is having a lot issues with moving his bowels.  He feels like he really needs to go, but the stool is too hard to pass.  He has a history of some type of colon surgery.  He denies N/V  Objective: Vital signs in last 24 hours: Temp:  [98.2 F (36.8 C)-98.7 F (37.1 C)] 98.2 F (36.8 C) (07/03 0700) Pulse Rate:  [52-79] 74 (07/03 0700) Cardiac Rhythm: Atrial fibrillation (07/02 2000) Resp:  [11-33] 20 (07/03 0700) BP: (105-175)/(49-108) 175/79 (07/03 0700) SpO2:  [95 %-100 %] 97 % (07/03 0700) Weight:  [311 lb 1.1 oz (141.1 kg)] 311 lb 1.1 oz (141.1 kg) (07/03 0700)  Filed Weights   04/16/18 0400 04/17/18 0449 04/18/18 0700  Weight: (!) 322 lb 15.6 oz (146.5 kg) (!) 310 lb 6.5 oz (140.8 kg) (!) 311 lb 1.1 oz (141.1 kg)    Weight change: 10.6 oz (0.3 kg)   Intake/Output from previous day: 07/02 0701 - 07/03 0700 In: 916.4 [P.O.:120; I.V.:796.4] Out: 2600 [Urine:2600]  Current Meds: Scheduled Meds: . acetaminophen  1,000 mg Oral Q6H   Or  . acetaminophen (TYLENOL) oral liquid 160 mg/5 mL  1,000 mg Per Tube Q6H  . amiodarone  400 mg Oral BID  . aspirin EC  325 mg Oral Daily   Or  . aspirin  324 mg Per Tube Daily  . atorvastatin  80 mg Oral q1800  . bisacodyl  10 mg Oral Daily   Or  . bisacodyl  10 mg Rectal Daily  . docusate sodium  200 mg Oral Daily  . enoxaparin (LOVENOX) injection  40 mg Subcutaneous QHS  . ferrous fumarate-b12-vitamic C-folic acid  1 capsule Oral TID PC  . furosemide  40 mg Oral BID  . levalbuterol  0.63 mg Nebulization TID  . mouth  rinse  15 mL Mouth Rinse BID  . metoprolol tartrate  25 mg Oral BID   Or  . metoprolol tartrate  25 mg Per Tube BID  . nicotine  14 mg Transdermal Daily  . pantoprazole  40 mg Oral Daily  . polyethylene glycol  17 g Oral Daily  . potassium chloride  20 mEq Oral BID  . sodium chloride flush  3 mL Intravenous Q12H   Continuous Infusions: . sodium chloride Stopped (04/15/18 1900)  . sodium chloride    . sodium chloride 10 mL/hr at 04/18/18 0700  . lactated ringers 33.3 mL/hr at 04/14/18 1908   PRN Meds:.sodium chloride, ALPRAZolam, metoprolol tartrate, ondansetron (ZOFRAN) IV, oxyCODONE, sodium chloride flush, traMADol  General appearance: alert, cooperative and no distress Heart: regular rate and rhythm Lungs: diminished breath sounds bibasilar Abdomen: soft, non-tender; bowel sounds normal; no masses,  no organomegaly Extremities: edema trace Wound: clean and dry  Lab Results: CBC: Recent Labs    04/17/18 0445 04/18/18 0305  WBC 10.8* 8.1  HGB 7.2* 7.3*  HCT 24.0* 24.4*  PLT 190 184  BMET:  Recent Labs    04/17/18 0445 04/18/18 0305  NA 132* 135  K 4.2 3.8  CL 98 97*  CO2 30 31  GLUCOSE 160* 156*  BUN 23* 24*  CREATININE 0.97 0.91  CALCIUM 7.8* 8.1*    CMET: Lab Results  Component Value Date   WBC 8.1 04/18/2018   HGB 7.3 (L) 04/18/2018   HCT 24.4 (L) 04/18/2018   PLT 184 04/18/2018   GLUCOSE 156 (H) 04/18/2018   ALT 29 04/14/2018   AST 47 (H) 04/14/2018   NA 135 04/18/2018   K 3.8 04/18/2018   CL 97 (L) 04/18/2018   CREATININE 0.91 04/18/2018   BUN 24 (H) 04/18/2018   CO2 31 04/18/2018   TSH 12.629 (H) 04/14/2018   INR 1.10 04/11/2018   HGBA1C 6.2 (H) 04/11/2018      PT/INR: No results for input(s): LABPROT, INR in the last 72 hours. Radiology: No results found.   Assessment/Plan: S/P Procedure(s) (LRB): AORTIC VALVE REPLACEMENT (AVR) (N/A) CORONARY ARTERY BYPASS GRAFTING (CABG) TIMES ONE USING LEFT INTERNAL MAMMARY ARTERY  (N/A) TRANSESOPHAGEAL ECHOCARDIOGRAM (TEE) (N/A)  1. CV- NSR, + HTN- will start low dose Cozaar, continue Amiodarone, Lopressor 2. Pulm- no acute issues, weaning oxygen as tolerated, CXR shows elevation of left diaphragm due to large volume of stool in colon, small left pleural effusion 3. Renal creatinine stable, weight is coming down, will transition to oral regimen of Lasix 40 mg BID 4. GI- constipation with likely stool impaction, will start Miralax daily, and give soap suds enema to see if patient can clear stool 5. Expected post operative blood loss anemia, Hgb stable at 7.3, continue iron supplement 6. CBGs controlled, stop SSIP, insulin and CBGs 7. Dispo- patient stable, will add low dose cozaar for HTN this morning, d/c EPW tomorrow, transition to oral lasix, treat constipation, awaiting transfer to 4E    Erin Barrett 04/18/2018 7:59 AM   OK for stepdown transfer maintaining nsr Pulmonary status improving Echo shows AVR w/ normal fx  patient examined and medical record reviewed,agree with above note. [name] 04/18/2018

## 2018-04-19 ENCOUNTER — Inpatient Hospital Stay (HOSPITAL_COMMUNITY): Payer: 59

## 2018-04-19 LAB — CBC
HCT: 24.4 % — ABNORMAL LOW (ref 39.0–52.0)
Hemoglobin: 7.4 g/dL — ABNORMAL LOW (ref 13.0–17.0)
MCH: 25.7 pg — ABNORMAL LOW (ref 26.0–34.0)
MCHC: 30.3 g/dL (ref 30.0–36.0)
MCV: 84.7 fL (ref 78.0–100.0)
Platelets: 227 10*3/uL (ref 150–400)
RBC: 2.88 MIL/uL — ABNORMAL LOW (ref 4.22–5.81)
RDW: 16.1 % — ABNORMAL HIGH (ref 11.5–15.5)
WBC: 8.8 10*3/uL (ref 4.0–10.5)

## 2018-04-19 LAB — BASIC METABOLIC PANEL
Anion gap: 7 (ref 5–15)
BUN: 20 mg/dL (ref 6–20)
CO2: 34 mmol/L — ABNORMAL HIGH (ref 22–32)
Calcium: 8.4 mg/dL — ABNORMAL LOW (ref 8.9–10.3)
Chloride: 98 mmol/L (ref 98–111)
Creatinine, Ser: 0.9 mg/dL (ref 0.61–1.24)
GFR calc Af Amer: >60 mL/min (ref >60)
GFR calc non Af Amer: >60 mL/min (ref >60)
Glucose, Bld: 157 mg/dL — ABNORMAL HIGH (ref 70–99)
Potassium: 4.2 mmol/L (ref 3.5–5.1)
Sodium: 139 mmol/L (ref 135–145)

## 2018-04-19 MED ORDER — LOSARTAN POTASSIUM 50 MG PO TABS
50.0000 mg | ORAL_TABLET | Freq: Every day | ORAL | Status: DC
  Administered 2018-04-19 – 2018-04-22 (×4): 50 mg via ORAL
  Filled 2018-04-19 (×4): qty 1

## 2018-04-19 MED ORDER — METOPROLOL TARTRATE 50 MG PO TABS
50.0000 mg | ORAL_TABLET | Freq: Two times a day (BID) | ORAL | Status: DC
  Administered 2018-04-19 – 2018-04-20 (×3): 50 mg via ORAL
  Filled 2018-04-19 (×3): qty 1

## 2018-04-19 MED ORDER — METOPROLOL TARTRATE 25 MG/10 ML ORAL SUSPENSION
25.0000 mg | Freq: Two times a day (BID) | ORAL | Status: DC
  Filled 2018-04-19: qty 10

## 2018-04-19 NOTE — Progress Notes (Addendum)
[address]       [place] [id number]             [phone]      6 Days Post-Op Procedure(s) (LRB): AORTIC VALVE REPLACEMENT (AVR) (N/A) CORONARY ARTERY BYPASS GRAFTING (CABG) TIMES ONE USING LEFT INTERNAL MAMMARY ARTERY (N/A) TRANSESOPHAGEAL ECHOCARDIOGRAM (TEE) (N/A) Subjective: Feels ok, + BM yesterday with enema   Objective: Vital signs in last 24 hours: Temp:  [98.1 F (36.7 C)-99.1 F (37.3 C)] 99.1 F (37.3 C) (07/04 0746) Pulse Rate:  [57-94] 71 (07/04 0746) Cardiac Rhythm: Heart block (07/04 0700) Resp:  [15-32] 20 (07/04 0746) BP: (110-151)/(56-86) 143/71 (07/04 0746) SpO2:  [95 %-100 %] 99 % (07/04 0746) Weight:  [139 kg (306 lb 7 oz)-141.6 kg (312 lb 2.7 oz)] 139 kg (306 lb 7 oz) (07/04 0451)  Hemodynamic parameters for last 24 hours:    Intake/Output from previous day: 07/03 0701 - 07/04 0700 In: 1099 [P.O.:1020; I.V.:79] Out: 200 [Urine:200] Intake/Output this shift: No intake/output data recorded.  General appearance: alert, cooperative and no distress Heart: regular rate and rhythm Lungs: clear to auscultation bilaterally Abdomen: obese, soft, nontender or distended+ BS Extremities: + edema Wound: incis healing well  Lab Results: Recent Labs    04/17/18 0445 04/18/18 0305  WBC 10.8* 8.1  HGB 7.2* 7.3*  HCT 24.0* 24.4*  PLT 190 184   BMET:  Recent Labs    04/17/18 0445 04/18/18 0305  NA 132* 135  K 4.2 3.8  CL 98 97*  CO2 30 31  GLUCOSE 160* 156*  BUN 23* 24*  CREATININE 0.97 0.91  CALCIUM 7.8* 8.1*    PT/INR: No results for input(s): LABPROT, INR in the last 72 hours. ABG    Component Value Date/Time   PHART 7.363 04/13/2018 1856   HCO3 27.0 04/13/2018 1856   TCO2 24 04/14/2018 1704   ACIDBASEDEF 2.0 04/13/2018 1740   O2SAT 99.0 04/13/2018 1856   CBG (last 3)  Recent Labs    04/17/18 2320 04/18/18 0755 04/18/18 1247  GLUCAP 104* 153* 133*    Meds Scheduled Meds: . amiodarone  400 mg  Oral BID  . aspirin EC  325 mg Oral Daily   Or  . aspirin  324 mg Per Tube Daily  . atorvastatin  80 mg Oral q1800  . bisacodyl  10 mg Oral Daily   Or  . bisacodyl  10 mg Rectal Daily  . docusate sodium  200 mg Oral Daily  . enoxaparin (LOVENOX) injection  40 mg Subcutaneous QHS  . ferrous fumarate-b12-vitamic C-folic acid  1 capsule Oral TID PC  . furosemide  40 mg Intravenous Daily  . losartan  25 mg Oral Daily  . mouth rinse  15 mL Mouth Rinse BID  . metoprolol tartrate  25 mg Oral BID   Or  . metoprolol tartrate  25 mg Per Tube BID  . nicotine  14 mg Transdermal Daily  . pantoprazole  40 mg Oral Daily  . polyethylene glycol  17 g Oral Daily  . potassium chloride  30 mEq Oral BID  . sodium chloride flush  3 mL Intravenous Q12H  . sorbitol  60 mL Oral Once   Continuous Infusions: . sodium chloride    . lactated ringers 33.3 mL/hr at 04/14/18 1908   PRN Meds:.ALPRAZolam, levalbuterol, metoprolol tartrate, ondansetron (ZOFRAN) IV, oxyCODONE, sodium chloride flush, traMADol  Xrays Dg Chest 2 View  Result Date: 04/19/2018 CLINICAL DATA:  Status  post aortic valve replacement EXAM: CHEST - 2 VIEW COMPARISON:  04/18/2018 FINDINGS: Cardiac shadow remains enlarged. Postsurgical changes are again seen. Right jugular sheath has been removed in the interval. Bibasilar atelectasis is noted left greater than right with small left pleural effusion. No pneumothorax is noted. IMPRESSION: Bibasilar changes left greater than right with small pleural effusion. Electronically Signed   By: Alcide Clever M.D.   On: 04/19/2018 07:05   Dg Chest 2 View  Result Date: 04/18/2018 CLINICAL DATA:  Status post CABG and aortic valve replacement 5 days ago. EXAM: CHEST - 2 VIEW COMPARISON:  Portable chest x-ray of April 16, 2018 FINDINGS: The lungs are somewhat better inflated today. There remains increased density in the retrocardiac region. There is patchy density at the right lung base medially. There is no large  pleural effusion and no pneumothorax. The cardiac silhouette remains enlarged but its margins are more distinct. The pulmonary vascularity is only mildly engorged today. The right internal jugular Cordis sheath tip projects over the proximal SVC. The sternal wires and prosthetic aortic valve are stable in appearance. IMPRESSION: Mild improvement in the pulmonary interstitium consistent with further decrease in interstitial edema. Improving bibasilar atelectasis or pneumonia. No pleural effusion or pneumothorax. Electronically Signed   By: David  [place] M.D.   On: 04/18/2018 08:56    Assessment/Plan: S/P Procedure(s) (LRB): AORTIC VALVE REPLACEMENT (AVR) (N/A) CORONARY ARTERY BYPASS GRAFTING (CABG) TIMES ONE USING LEFT INTERNAL MAMMARY ARTERY (N/A) TRANSESOPHAGEAL ECHOCARDIOGRAM (TEE) (N/A)  1 elevated BP, otherwise hemodyn stable in sinus rhythm with PVC's- will increase ARB dose. BMET is pending 2 PVC's EF25-30%, there is some room to increase beta blocker dose 3 volume overload- cont to diurese 4 Hgb A1c is 6.2 with some elevated BS, may need further management as outpatient with severe metabolic syndrome- discussed decreasing carbs/sugars which he says he does better at home. He has also gained a lot of weight he loss after gastric bypass. Has not been seeing MD in several years but knows he needs to find a primary care provider and not just use [facility] for long term care 5 wean O2 off, routine pulm toilet and cardiac rehab 6 he did have BM after enema- constipation issues are chronic 7 d/c epw's   LOS: 6 days    Rowe Clack 04/19/2018   Chart reviewed, patient examined, agree with above. BMET today ok. Hgb 7.4 but stable from yesterday.

## 2018-04-19 NOTE — Progress Notes (Signed)
Epicardial pacing wiring removed, per protocol.  Vital signs stable while monitoring q15 min for 1 hr.  No complications noted.

## 2018-04-19 NOTE — Progress Notes (Signed)
Physical Therapy Treatment Patient Details Name: Kyle Weaver. MRN: [id number] DOB: 10/09/1956 Today's Date: 04/19/2018    History of Present Illness Patient with PMH of CAD, AS, DM, and HTN admitted for AVR and CABG x 1.    PT Comments    Pt progressing well with ambulation tolerance however cont. To have drop in SpO2 into 80s even on 4Lo2 via [place] during ambulation. Attempted 2Lo2 via [place] however pt very winded and SpO2 in 70s. Acute PT to cont.to follow.    Follow Up Recommendations  Home health PT     Equipment Recommendations  Rolling walker with 5" wheels    Recommendations for Other Services       Precautions / Restrictions Precautions Precautions: Sternal Restrictions Weight Bearing Restrictions: Yes Other Position/Activity Restrictions: sternal precautions    Mobility  Bed Mobility Overal bed mobility: Needs Assistance Bed Mobility: Sit to Supine       Sit to supine: HOB elevated;Supervision   General bed mobility comments: verbal cues to not reach back with L UE, pt able to bring LEs up into bed  Transfers Overall transfer level: Needs assistance Equipment used: Rolling walker (2 wheeled) Transfers: Sit to/from Stand Sit to Stand: Min guard         General transfer comment: hands on the knees, rocking forward, no physical assist needed  Ambulation/Gait Ambulation/Gait assistance: Min guard Gait Distance (Feet): 200 Feet(x2) Assistive device: Rolling walker (2 wheeled) Gait Pattern/deviations: Step-through pattern;Decreased stride length;Wide base of support Gait velocity: slow Gait velocity interpretation: <1.31 ft/sec, indicative of household ambulator General Gait Details: verbal cues to minimize UE WBing and inc LE wbing. Pt with labored breathing. SpO2 decreases into 80s on 4LO2 via [place]. Pt required seated restt break. Pt amb to/from bathroom without RW. pt very slow, wide base of support and step to gait pattern and not as steady   Metallurgist Rankin (Stroke Patients Only)       Balance Overall balance assessment: Needs assistance Sitting-balance support: No upper extremity supported;Feet supported Sitting balance-Leahy Scale: Good     Standing balance support: During functional activity;No upper extremity supported Standing balance-Leahy Scale: Fair Standing balance comment: pt leaned trunk up against sink to perform hand hygiene                            Cognition Arousal/Alertness: Awake/alert Behavior During Therapy: WFL for tasks assessed/performed Overall Cognitive Status: Within Functional Limits for tasks assessed                                        Exercises      General Comments General comments (skin integrity, edema, etc.): no drainage from incision      Pertinent Vitals/Pain Pain Assessment: 0-10 Pain Score: 2  Pain Location: chest Pain Descriptors / Indicators: Sore Pain Intervention(s): Monitored during session    Home Living                      Prior Function            PT Goals (current goals can now be found in the care plan section) Acute Rehab PT Goals Patient Stated Goal: to return to independent Progress towards PT goals: Progressing toward  goals    Frequency    Min 3X/week      PT Plan Current plan remains appropriate    Co-evaluation              AM-PAC PT "6 Clicks" Daily Activity  Outcome Measure  Difficulty turning over in bed (including adjusting bedclothes, sheets and blankets)?: None Difficulty moving from lying on back to sitting on the side of the bed? : None Difficulty sitting down on and standing up from a chair with arms (e.g., wheelchair, bedside commode, etc,.)?: None Help needed moving to and from a bed to chair (including a wheelchair)?: A Little Help needed walking in hospital room?: A Little Help needed climbing 3-5 steps with a railing? : A Little 6  Click Score: 21    End of Session Equipment Utilized During Treatment: Gait belt;Oxygen Activity Tolerance: Patient tolerated treatment well Patient left: in bed;with call bell/phone within reach;with family/visitor present Nurse Communication: Mobility status PT Visit Diagnosis: Unsteadiness on feet (R26.81);Other abnormalities of gait and mobility (R26.89)     Time: [id number] PT Time Calculation (min) (ACUTE ONLY): 27 min  Charges:  $Gait Training: 23-37 mins                    G Codes:       Lewis ShockAshly Kyna Blahnik, PT, DPT Pager #: [phone] Office #: [phone]    Dominigue Gellner M Mikhayla Phillis 04/19/2018, 2:32 PM

## 2018-04-20 ENCOUNTER — Inpatient Hospital Stay (HOSPITAL_COMMUNITY): Payer: 59

## 2018-04-20 LAB — GLUCOSE, CAPILLARY: Glucose-Capillary: 178 mg/dL — ABNORMAL HIGH (ref 70–99)

## 2018-04-20 LAB — CBC
HCT: 26.2 % — ABNORMAL LOW (ref 39.0–52.0)
Hemoglobin: 7.6 g/dL — ABNORMAL LOW (ref 13.0–17.0)
MCH: 25.1 pg — ABNORMAL LOW (ref 26.0–34.0)
MCHC: 29 g/dL — ABNORMAL LOW (ref 30.0–36.0)
MCV: 86.5 fL (ref 78.0–100.0)
Platelets: 269 10*3/uL (ref 150–400)
RBC: 3.03 MIL/uL — ABNORMAL LOW (ref 4.22–5.81)
RDW: 16.3 % — ABNORMAL HIGH (ref 11.5–15.5)
WBC: 7.2 10*3/uL (ref 4.0–10.5)

## 2018-04-20 MED ORDER — FUROSEMIDE 40 MG PO TABS
40.0000 mg | ORAL_TABLET | Freq: Every day | ORAL | Status: DC
  Administered 2018-04-20 – 2018-04-22 (×3): 40 mg via ORAL
  Filled 2018-04-20 (×3): qty 1

## 2018-04-20 MED ORDER — METOPROLOL TARTRATE 25 MG PO TABS
25.0000 mg | ORAL_TABLET | Freq: Two times a day (BID) | ORAL | Status: DC
  Administered 2018-04-20 – 2018-04-22 (×4): 25 mg via ORAL
  Filled 2018-04-20 (×4): qty 1

## 2018-04-20 MED ORDER — SODIUM CHLORIDE 0.9 % IV SOLN
510.0000 mg | Freq: Once | INTRAVENOUS | Status: AC
  Administered 2018-04-20: 510 mg via INTRAVENOUS
  Filled 2018-04-20: qty 17

## 2018-04-20 MED ORDER — METOPROLOL TARTRATE 25 MG/10 ML ORAL SUSPENSION: 25.0000 mg | Freq: Two times a day (BID) | ORAL | Status: DC

## 2018-04-20 MED ORDER — LIDOCAINE HCL (PF) 2 % IJ SOLN
INTRAMUSCULAR | Status: AC
  Filled 2018-04-20: qty 20

## 2018-04-20 NOTE — Progress Notes (Signed)
CARDIAC REHAB PHASE I   PRE:  Rate/Rhythm: 54 SB  BP:  Sitting: 127/75      SaO2: 94 1L    72 RA  MODE:  Ambulation: 470 ft   POST:  Rate/Rhythm: 66 SR  BP:  Sitting: 126/77    SaO2: 88 1L   Pt ambulated 470 ft in hallway standby assist with 1L O2. Pt took 3 standing rest breaks. C/o SOB on return to room. Pt coached through pursed lip breathing. Pt sats maintain >88 on RA. Pt demonstrated 1000 on IS and flutter use. Plan to meet with pt and wife tomorrow at 11a to review d/c education for poss d/c Sun. Encouraged two more walks today. Will continue to follow.  [id number] Kyle Weaver  Kyle Foronda, RN BSN 04/20/2018 10:13 AM

## 2018-04-20 NOTE — Progress Notes (Signed)
Patient ordered for possible thoracentesis.  No fluid identified on bilateral Limited US Chest. No procedure performed.  Loyce DysKacie Aasiya Creasey, MS RD PA-C 2:40 PM

## 2018-04-20 NOTE — Progress Notes (Signed)
Physical Therapy Treatment Patient Details Name: Kyle Weaver. MRN: [id number] DOB: 01/26/1956 Today's Date: 04/20/2018    History of Present Illness Patient with PMH of CAD, AS, DM, and HTN admitted for AVR and CABG x 1.    PT Comments    Patient progressing with ambulation and endurance, though remains dependent on oxygen.  May benefit from PT to practice stairs prior to d/c home.    Follow Up Recommendations  Home health PT     Equipment Recommendations  Rolling walker with 5" wheels    Recommendations for Other Services       Precautions / Restrictions Precautions Precautions: Sternal Precaution Comments: watch sats if O2 < 4L    Mobility  Bed Mobility   Bed Mobility: Sit to Supine       Sit to supine: Supervision   General bed mobility comments: for technique, safety  Transfers   Equipment used: None Transfers: Sit to/from Stand Sit to Stand: Supervision         General transfer comment: no assist for sit to stand from EOB  Ambulation/Gait Ambulation/Gait assistance: Supervision Gait Distance (Feet): 400 Feet Assistive device: None Gait Pattern/deviations: Step-through pattern     General Gait Details: SpO2 on 2L O2 with ambulation down to 88%, on 3L O2 down to 86% increased with PLB to 90% rest of distance on 4L O2 with SpO2 90-89%   Stairs             Wheelchair Mobility    Modified Rankin (Stroke Patients Only)       Balance Overall balance assessment: Needs assistance Sitting-balance support: No upper extremity supported;Feet supported Sitting balance-Leahy Scale: Good     Standing balance support: No upper extremity supported Standing balance-Leahy Scale: Good                              Cognition Arousal/Alertness: Awake/alert Behavior During Therapy: WFL for tasks assessed/performed Overall Cognitive Status: Within Functional Limits for tasks assessed                                        Exercises      General Comments General comments (skin integrity, edema, etc.): cues for proper IS use with slower inspiration; SpO2 96% after use of IS on 4L O2; Discussed stairs and pt not concerned about home entry, but agreed to practice next session      Pertinent Vitals/Pain Faces Pain Scale: No hurt    Home Living                      Prior Function            PT Goals (current goals can now be found in the care plan section) Progress towards PT goals: Progressing toward goals    Frequency    Min 3X/week      PT Plan Current plan remains appropriate    Co-evaluation              AM-PAC PT "6 Clicks" Daily Activity  Outcome Measure  Difficulty turning over in bed (including adjusting bedclothes, sheets and blankets)?: None Difficulty moving from lying on back to sitting on the side of the bed? : None Difficulty sitting down on and standing up from a chair with arms (e.g., wheelchair, bedside commode, etc,.)?: None  Help needed moving to and from a bed to chair (including a wheelchair)?: A Little Help needed walking in hospital room?: A Little Help needed climbing 3-5 steps with a railing? : A Little 6 Click Score: 21    End of Session Equipment Utilized During Treatment: Oxygen Activity Tolerance: Patient tolerated treatment well Patient left: in bed;with call bell/phone within reach   PT Visit Diagnosis: Unsteadiness on feet (R26.81);Other abnormalities of gait and mobility (R26.89)     Time: [id number] PT Time Calculation (min) (ACUTE ONLY): 26 min  Charges:  $Gait Training: 8-22 mins $Therapeutic Activity: 8-22 mins                    G CodesSheran Weaver:       Kyle Weaver, [place]PT [phone] 04/20/2018    Elray Mcgregorynthia Weaver 04/20/2018, 4:22 PM

## 2018-04-20 NOTE — Progress Notes (Signed)
Received call from [facility]- [employer] [facility]- spoke with Tanessa - pt's intake ID- [id number]. Per conversation they are still working [facility]. Will return call when agency is found and also call pt with information. Still awaiting clearance approval for DME needs- they usually contract with [employer].

## 2018-04-20 NOTE — Progress Notes (Addendum)
      [address]       [place] [id number]             [phone]      7 Days Post-Op Procedure(s) (LRB): AORTIC VALVE REPLACEMENT (AVR) (N/A) CORONARY ARTERY BYPASS GRAFTING (CABG) TIMES ONE USING LEFT INTERNAL MAMMARY ARTERY (N/A) TRANSESOPHAGEAL ECHOCARDIOGRAM (TEE) (N/A)   Subjective:  No new complaints.  Patient states it would be better for his care takers for him to be discharged on Sunday.  Objective: Vital signs in last 24 hours: Temp:  [98.1 F (36.7 C)-99.1 F (37.3 C)] 98.1 F (36.7 C) (07/05 0411) Pulse Rate:  [35-79] 65 (07/05 0411) Cardiac Rhythm: Heart block;Bundle branch block (07/04 1900) Resp:  [12-26] 18 (07/05 0411) BP: (111-163)/(43-87) 151/60 (07/05 0411) SpO2:  [86 %-100 %] 94 % (07/05 0411) Weight:  [307 lb (139.3 kg)] 307 lb (139.3 kg) (07/05 0411)  Intake/Output from previous day: 07/04 0701 - 07/05 0700 In: 1143 [P.O.:1140; I.V.:3] Out: 500 [Urine:500]  General appearance: alert, cooperative and no distress Heart: regular rate and rhythm Lungs: clear to auscultation bilaterally Abdomen: soft, non-tender; bowel sounds normal; no masses,  no organomegaly Extremities: edema trace Wound: clean and dry  Lab Results: Recent Labs    04/19/18 0722 04/20/18 0338  WBC 8.8 7.2  HGB 7.4* 7.6*  HCT 24.4* 26.2*  PLT 227 269   BMET:  Recent Labs    04/18/18 0305 04/19/18 0722  NA 135 139  K 3.8 4.2  CL 97* 98  CO2 31 34*  GLUCOSE 156* 157*  BUN 24* 20  CREATININE 0.91 0.90  CALCIUM 8.1* 8.4*    PT/INR: No results for input(s): LABPROT, INR in the last 72 hours. ABG    Component Value Date/Time   PHART 7.363 04/13/2018 1856   HCO3 27.0 04/13/2018 1856   TCO2 24 04/14/2018 1704   ACIDBASEDEF 2.0 04/13/2018 1740   O2SAT 99.0 04/13/2018 1856   CBG (last 3)  Recent Labs    04/17/18 2320 04/18/18 0755 04/18/18 1247  GLUCAP 104* 153* 133*    Assessment/Plan: S/P Procedure(s) (LRB): AORTIC VALVE REPLACEMENT  (AVR) (N/A) CORONARY ARTERY BYPASS GRAFTING (CABG) TIMES ONE USING LEFT INTERNAL MAMMARY ARTERY (N/A) TRANSESOPHAGEAL ECHOCARDIOGRAM (TEE) (N/A)  1. CV- NSR with 1st degree AV Block, HTN- continue Lopressor, Cozaar, Amiodarone 2. Pulm- need to wean oxygen, sats are 99% on oxygen, spoke with nurse directly to turn off oxygen as long as sats are above 88 3. Renal- creatinine has been stable, weight it trending down, continue Lasix 4. GI-constipation resolved 5. Dispo- patient stable, H/H arrangements have been made, wean oxygen off today, plan to d/c on Sunday as patient's caretakers will be ready then   LOS: 7 days    Kyle Weaver 04/20/2018   Patient remains with significant acute on chronic anemia despite oral iron-we will give 1 dose of Feraheme Chest x-ray shows moderate left postop pleural effusion-will treat with ultrasound directed thoracentesis. Heart rate less than 60-reduce metoprolol to 25 p.o. twice daily and continue oral amiodarone Start Eliquis at discharge  patient examined and medical record reviewed,agree with above note. [name] 04/20/2018

## 2018-04-21 ENCOUNTER — Inpatient Hospital Stay (HOSPITAL_COMMUNITY): Payer: 59

## 2018-04-21 LAB — CBC
HCT: 25.6 % — ABNORMAL LOW (ref 39.0–52.0)
Hemoglobin: 7.8 g/dL — ABNORMAL LOW (ref 13.0–17.0)
MCH: 25.8 pg — ABNORMAL LOW (ref 26.0–34.0)
MCHC: 30.5 g/dL (ref 30.0–36.0)
MCV: 84.8 fL (ref 78.0–100.0)
Platelets: 308 10*3/uL (ref 150–400)
RBC: 3.02 MIL/uL — ABNORMAL LOW (ref 4.22–5.81)
RDW: 16.2 % — ABNORMAL HIGH (ref 11.5–15.5)
WBC: 7.6 10*3/uL (ref 4.0–10.5)

## 2018-04-21 LAB — CREATININE, SERUM
Creatinine, Ser: 1 mg/dL (ref 0.61–1.24)
GFR calc Af Amer: >60 mL/min (ref >60)
GFR calc non Af Amer: >60 mL/min (ref >60)

## 2018-04-21 MED ORDER — POLYETHYLENE GLYCOL 3350 17 G PO PACK: 17.0000 g | PACK | Freq: Once | ORAL | Status: DC

## 2018-04-21 MED ORDER — AMIODARONE HCL 200 MG PO TABS: 200.0000 mg | ORAL_TABLET | Freq: Two times a day (BID) | ORAL | 1 refills | Status: DC

## 2018-04-21 MED ORDER — POTASSIUM CHLORIDE CRYS ER 20 MEQ PO TBCR: 20.0000 meq | EXTENDED_RELEASE_TABLET | Freq: Every day | ORAL | 0 refills | Status: DC

## 2018-04-21 MED ORDER — NICOTINE 14 MG/24HR TD PT24: 14.0000 mg | MEDICATED_PATCH | Freq: Every day | TRANSDERMAL | 0 refills | Status: DC

## 2018-04-21 MED ORDER — LOSARTAN POTASSIUM 50 MG PO TABS: 50.0000 mg | ORAL_TABLET | Freq: Every day | ORAL | 1 refills | Status: AC

## 2018-04-21 MED ORDER — FUROSEMIDE 40 MG PO TABS: 40.0000 mg | ORAL_TABLET | Freq: Every day | ORAL | 0 refills | Status: DC

## 2018-04-21 MED ORDER — OXYCODONE HCL 5 MG PO TABS: ORAL_TABLET | ORAL | 0 refills | Status: DC

## 2018-04-21 MED ORDER — METOPROLOL SUCCINATE ER 50 MG PO TB24: 50.0000 mg | ORAL_TABLET | Freq: Every day | ORAL | 1 refills | Status: AC

## 2018-04-21 MED ORDER — AMIODARONE HCL 200 MG PO TABS
200.0000 mg | ORAL_TABLET | Freq: Two times a day (BID) | ORAL | Status: DC
  Administered 2018-04-22: 200 mg via ORAL
  Filled 2018-04-21: qty 1

## 2018-04-21 MED ORDER — ASPIRIN 325 MG PO TBEC: 325.0000 mg | DELAYED_RELEASE_TABLET | Freq: Every day | ORAL | 0 refills | Status: DC

## 2018-04-21 NOTE — Discharge Instructions (Signed)
Discharge Instructions:  1. You may shower, please wash incisions daily with soap and water and keep dry.  If you wish to cover wounds with dressing you may do so but please keep clean and change daily.  No tub baths or swimming until incisions have completely healed.  If your incisions become red or develop any drainage please call our office at [phone]  2. No Driving until cleared by Dr. Zenaida Niece Trigt's  office and you are no longer using narcotic pain medications  3. Monitor your weight daily.. Please use the same scale and weigh at same time... If you gain 5-10 lbs in 48 hours with associated lower extremity swelling, please contact our office at [phone]  4. Fever of 101.5 for at least 24 hours with no source, please contact our office at [phone]  Prediabetes Eating Plan Prediabetes--also called impaired glucose tolerance or impaired fasting glucose--is a condition that causes blood sugar (blood glucose) levels to be higher than normal. Following a healthy diet can help to keep prediabetes under control. It can also help to lower the risk of type 2 diabetes and heart disease, which are increased in people who have prediabetes. Along with regular exercise, a healthy diet:  Promotes weight loss.  Helps to control blood sugar levels.  Helps to improve the way that the body uses insulin.  What do I need to know about this eating plan?  Use the glycemic index (GI) to plan your meals. The index tells you how quickly a food will raise your blood sugar. Choose low-GI foods. These foods take a longer time to raise blood sugar.  Pay close attention to the amount of carbohydrates in the food that you eat. Carbohydrates increase blood sugar levels.  Keep track of how many calories you take in. Eating the right amount of calories will help you to achieve a healthy weight. Losing about 7 percent of your starting weight can help to prevent type 2 diabetes.  You may want to follow a  Mediterranean diet. This diet includes a lot of vegetables, lean meats or fish, whole grains, fruits, and healthy oils and fats. What foods can I eat? Grains Whole grains, such as whole-wheat or whole-grain breads, crackers, cereals, and pasta. Unsweetened oatmeal. Bulgur. Barley. Quinoa. Brown rice. Corn or whole-wheat flour tortillas or taco shells. Vegetables Lettuce. Spinach. Peas. Beets. Cauliflower. Cabbage. Broccoli. Carrots. Tomatoes. Squash. Eggplant. Herbs. Peppers. Onions. Cucumbers. Brussels sprouts. Fruits Berries. Bananas. Apples. Oranges. Grapes. Papaya. Mango. Pomegranate. Kiwi. Grapefruit. Cherries. Meats and Other Protein Sources Seafood. Lean meats, such as chicken and [place] or lean cuts of pork and beef. Tofu. Eggs. Nuts. Beans. Dairy Low-fat or fat-free dairy products, such as yogurt, cottage cheese, and cheese. Beverages Water. Tea. Coffee. Sugar-free or diet soda. Seltzer water. Milk. Milk alternatives, such as soy or almond milk. Condiments Mustard. Relish. Low-fat, low-sugar ketchup. Low-fat, low-sugar barbecue sauce. Low-fat or fat-free mayonnaise. Sweets and Desserts Sugar-free or low-fat pudding. Sugar-free or low-fat ice cream and other frozen treats. Fats and Oils Avocado. Walnuts. Olive oil. The items listed above may not be a complete list of recommended foods or beverages. Contact your dietitian for more options. What foods are not recommended? Grains Refined white flour and flour products, such as bread, pasta, snack foods, and cereals. Beverages Sweetened drinks, such as sweet iced tea and soda. Sweets and Desserts Baked goods, such as cake, cupcakes, pastries, cookies, and cheesecake. The items listed above may not be a complete list of foods and beverages to  avoid. Contact your dietitian for more information. This information is not intended to replace advice given to you by your health care provider. Make sure you discuss any questions you have with  your health care provider. Document Released: 02/17/2015 Document Revised: 03/10/2016 Document Reviewed: 10/29/2014 Elsevier Interactive Patient Education  2017 Elsevier Inc.   5. Activity- up as tolerated, please walk at least 3 times per day.  Avoid strenuous activity, no lifting, pushing, or pulling with your arms over 8-10 lbs for a minimum of 6 weeks  6. If any questions or concerns arise, please do not hesitate to contact our office at [phone]

## 2018-04-21 NOTE — Progress Notes (Signed)
CARDIAC REHAB PHASE I   Offered to walk with pt, pt states he has walked twice this morning already. Pt and wife educated on importance of sternal precautions, and  continued IS use. Pt and wife given heart healthy and diabetic diets. Reviewed restrictions and exercise guidelines. Pt educated to shower and monitor incisions daily. Will send referral to [facility].   [phone] Kyle Weaver  Kyle Alemu, RN BSN 04/21/2018 11:30 AM

## 2018-04-21 NOTE — Progress Notes (Addendum)
      [address]       [place] [id number]             [phone]        8 Days Post-Op Procedure(s) (LRB): AORTIC VALVE REPLACEMENT (AVR) (N/A) CORONARY ARTERY BYPASS GRAFTING (CABG) TIMES ONE USING LEFT INTERNAL MAMMARY ARTERY (N/A) TRANSESOPHAGEAL ECHOCARDIOGRAM (TEE) (N/A)  Subjective: Patient requesting Dulcolax and Miralax for bowels. He states he really does not want to go home on oxygen.  Objective: Vital signs in last 24 hours: Temp:  [98 F (36.7 C)-98.6 F (37 C)] 98.4 F (36.9 C) (07/06 0642) Pulse Rate:  [58-72] 65 (07/06 0642) Cardiac Rhythm: Heart block (07/05 1900) Resp:  [11-31] 18 (07/06 0642) BP: (124-163)/(51-80) 124/58 (07/06 0642) SpO2:  [85 %-98 %] 98 % (07/06 0642) Weight:  [301 lb 1.6 oz (136.6 kg)] 301 lb 1.6 oz (136.6 kg) (07/06 0425)  Pre op weight 137 kg Current Weight  04/21/18 (!) 301 lb 1.6 oz (136.6 kg)      Intake/Output from previous day: 07/05 0701 - 07/06 0700 In: 357 [P.O.:240; IV Piggyback:117] Out: -    Physical Exam:  Cardiovascular: RRR, murmur Pulmonary: Slightly diminished at bases Abdomen: Soft, non tender, bowel sounds present. Extremities: Mild bilateral lower extremity edema. Wounds: Clean and dry.  No erythema or signs of infection.  Lab Results: CBC: Recent Labs    04/20/18 0338 04/21/18 0338  WBC 7.2 7.6  HGB 7.6* 7.8*  HCT 26.2* 25.6*  PLT 269 308   BMET:  Recent Labs    04/19/18 0722 04/21/18 0338  NA 139  --   K 4.2  --   CL 98  --   CO2 34*  --   GLUCOSE 157*  --   BUN 20  --   CREATININE 0.90 1.00  CALCIUM 8.4*  --     PT/INR:  Lab Results  Component Value Date   INR 1.10 04/11/2018   ABG:  INR: Will add last result for INR, ABG once components are confirmed Will add last 4 CBG results once components are confirmed  Assessment/Plan:  1. CV - First degree heart block, PAF previously. Maintaining SR.  On Amiodarone 400 mg bid, Losartan 50 mg daily, and  Lopressor 25 mg bid. Per Dr. Donata ClayVan Trigt, will restart Eliquis at discharge. 2.  Pulmonary - On 1 liter of oxygen via [place]. Wean to room air as tolerates. Nurse will walk and document oxygenation as may need 1 liter via  at discharge. Thoracentesis ordered yesterday but US showed no significant pleural effusion. Await today's CXR. Encourage incentive spirometer. 3. Volume Overload - On Lasix 40 mg daily 4.  Anemia - H and H this am stable at 7.8 and 25.6. Continue Trinsicon. 5. Pre diabetes-pre op HGA1C 6.2. Needs to follow up with a medical doctor after discharge 6. Home with HH in am  Donielle M ZimmermanPA-C 04/21/2018,7:17 AM [phone]   Chart reviewed, patient examined, agree with above. CXR looks about the same. I think there is bibasilar atelectasis but apparently no effusion seen on US of chest yesterday. Weight is now back to preop. Continue IS, ambulation. Probably home tomorrow.

## 2018-04-22 MED ORDER — SORBITOL 70 % PO SOLN
30.0000 mL | Freq: Once | ORAL | Status: DC
  Filled 2018-04-22 (×2): qty 30

## 2018-04-22 MED ORDER — APIXABAN 5 MG PO TABS: 5.0000 mg | ORAL_TABLET | Freq: Two times a day (BID) | ORAL | 1 refills | Status: DC

## 2018-04-22 MED ORDER — ASPIRIN EC 81 MG PO TBEC
81.0000 mg | DELAYED_RELEASE_TABLET | Freq: Every day | ORAL | Status: DC
  Administered 2018-04-22: 81 mg via ORAL
  Filled 2018-04-22: qty 1

## 2018-04-22 MED ORDER — SORBITOL 70 % PO SOLN: 30.0000 mL | Freq: Once | ORAL | Status: DC

## 2018-04-22 MED ORDER — SORBITOL 70 % SOLN
30.0000 mL | Freq: Once | Status: AC
  Administered 2018-04-22: 30 mL via ORAL
  Filled 2018-04-22 (×3): qty 30

## 2018-04-22 NOTE — Care Management Note (Signed)
Case Management Note  Patient Details  Name: Kyle Weaver. MRN: [id number] Date of Birth: 1955-12-29  Subjective/Objective:   Pt presented for CABG. Pt from home with wife and supportive family.            Action/Plan: [facility].  Wife states [facility] contacted her yesterday to deliver 3n1 to their home but they did not deliver.  [facility] was contacted and they state they attempted to deliver and no one answered door.  Also, RW is on backorder.  DME will be delivered to patient tomorrow.  Pt states he would like to have the DME but does not have to have and can wait until tomorrow.    Pt given Eliquis cards.  Expected Discharge Date:  04/22/18               Expected Discharge Plan:  Home w Home Health Services  In-House Referral:  NA  Discharge planning Services  CM Consult  Post Acute Care Choice:  Durable Medical Equipment, Home Health Choice offered to:  Patient  DME Arranged:  Walker rolling, 3-N-1 [facility]ME [facility]   [facility]  HH Arranged:  RN, PT HH Agency:   per [facility]  Status of Service:     If discussed at Long Length of Stay Meetings, dates discussed:    Additional Comments:  Deveron Furlongshley  Albirta Rhinehart, RN 04/22/2018, 2:10 PM

## 2018-04-22 NOTE — Progress Notes (Addendum)
      [address]       [place] [id number]             [phone]        9 Days Post-Op Procedure(s) (LRB): AORTIC VALVE REPLACEMENT (AVR) (N/A) CORONARY ARTERY BYPASS GRAFTING (CABG) TIMES ONE USING LEFT INTERNAL MAMMARY ARTERY (N/A) TRANSESOPHAGEAL ECHOCARDIOGRAM (TEE) (N/A)  Subjective: Patient requesting specific medications this am to help move bowels.  Objective: Vital signs in last 24 hours: Temp:  [98 F (36.7 C)-99.4 F (37.4 C)] 98.1 F (36.7 C) (07/07 0417) Pulse Rate:  [56-74] 59 (07/07 0559) Cardiac Rhythm: Normal sinus rhythm;Heart block (07/06 1900) Resp:  [16-19] 18 (07/07 0400) BP: (118-152)/(56-71) 118/56 (07/07 0400) SpO2:  [90 %-96 %] 95 % (07/07 0559) Weight:  [299 lb 2.6 oz (135.7 kg)] 299 lb 2.6 oz (135.7 kg) (07/07 0559)  Pre op weight 137 kg Current Weight  04/22/18 299 lb 2.6 oz (135.7 kg)      Intake/Output from previous day: No intake/output data recorded.   Physical Exam:  Cardiovascular: RRR, murmur Pulmonary: Slightly diminished at bases Abdomen: Soft, non tender, bowel sounds present. Extremities: Mild bilateral lower extremity edema. Wounds: Clean and dry.  No erythema or signs of infection.  Lab Results: CBC: Recent Labs    04/20/18 0338 04/21/18 0338  WBC 7.2 7.6  HGB 7.6* 7.8*  HCT 26.2* 25.6*  PLT 269 308   BMET:  Recent Labs    04/21/18 0338  CREATININE 1.00    PT/INR:  Lab Results  Component Value Date   INR 1.10 04/11/2018   ABG:  INR: Will add last result for INR, ABG once components are confirmed Will add last 4 CBG results once components are confirmed  Assessment/Plan:  1. CV - First degree heart block, PAF previously. Maintaining SR.  On Amiodarone 200 mg bid, Losartan 50 mg daily, and Lopressor 25 mg bid. Per Dr. Donata ClayVan Trigt, will start Eliquis at discharge and decrease ecasa to 81 mg daily. 2.  Pulmonary - On room air this am. According to nurse, his oxygenation remains in  the 90's at rest and on room air with ambulation  CXR. Encourage incentive spirometer. 3. Volume Overload - On Lasix 40 mg daily 4.  Anemia - H and H this am stable at 7.8 and 25.6. Continue Trinsicon. 5. Pre diabetes-pre op HGA1C 6.2. Needs to follow up with a medical doctor after discharge 6. Per patient request, Sorbitol followed by Miralax 7. [facility]  Ermagene Saidi M ZimmermanPA-C 04/22/2018,7:28 AM [phone]

## 2018-04-23 LAB — BPAM RBC
Blood Product Expiration Date: 201907222359
Blood Product Expiration Date: 201907222359
ISSUE DATE / TIME: 201906280832
ISSUE DATE / TIME: 201906280832
Unit Type and Rh: 6200
Unit Type and Rh: 6200

## 2018-04-23 LAB — TYPE AND SCREEN
ABO/RH(D): A POS
Antibody Screen: NEGATIVE
Unit division: 0
Unit division: 0

## 2018-04-25 ENCOUNTER — Encounter: Payer: Self-pay | Admitting: *Deleted

## 2018-04-25 NOTE — Progress Notes (Signed)
A representative from [facility], the home care placement facilitator for [name]. Lore [employer] called the office yesterday and today to relate they have been unable to find an agency [facility]. This has been either due to his insurance or nonavailability of staff.   I reviewed his inpatient course and really couldn't find any documentation of needs for this care. PT did mention some unsteadiness while ambulating. I called and spoke with our P.A. who had discharged him. She, too, could not specifically see the need. She did say he needed motivation in the hospital for ambulation. I told [facility] to stop their search as I felt enough time had lapsed to find home health services. They agreed.

## 2018-05-14 ENCOUNTER — Telehealth: Payer: Self-pay

## 2018-05-14 NOTE — Telephone Encounter (Signed)
Kyle Weaver wife contacted the office concerned about his blood pressure and heart rate.  She stated that his blood pressure when checked was 98/80 when asked his heart rate was 100.  She stated that he has not check his blood pressure in a week and this is lower than usual.  She stated that he had been dizzy, weak, coughing, and congested.  I advised her that he may not be used to having his blood pressure that low or he may have been getting up too quickly and could have caused his BP to drop causing the dizziness.  I advised ways to reduce orthostatic hypotension and felt as if this was an isolated incident and to give the office a call if it continues.  She acknowledged receipt.  She stated that his incisions were good, appetite was ok and he was drinking enough.

## 2018-05-15 ENCOUNTER — Other Ambulatory Visit: Payer: Self-pay | Admitting: Cardiothoracic Surgery

## 2018-05-15 DIAGNOSIS — Z951 Presence of aortocoronary bypass graft: Secondary | ICD-10-CM

## 2018-05-16 ENCOUNTER — Other Ambulatory Visit: Payer: Self-pay

## 2018-05-16 ENCOUNTER — Ambulatory Visit
Admission: RE | Admit: 2018-05-16 | Discharge: 2018-05-16 | Disposition: A | Discharge status: Home / Self Care | Payer: Managed Care, Other (non HMO) | Source: Ambulatory Visit | Attending: Cardiothoracic Surgery | Admitting: Cardiothoracic Surgery

## 2018-05-16 ENCOUNTER — Ambulatory Visit (INDEPENDENT_AMBULATORY_CARE_PROVIDER_SITE_OTHER): Payer: Self-pay | Admitting: Cardiothoracic Surgery

## 2018-05-16 ENCOUNTER — Encounter: Payer: Self-pay | Admitting: Cardiothoracic Surgery

## 2018-05-16 VITALS — BP 112/67 | HR 61 | Resp 18 | Ht 74.0 in | Wt 284.0 lb

## 2018-05-16 DIAGNOSIS — I2510 Atherosclerotic heart disease of native coronary artery without angina pectoris: Secondary | ICD-10-CM

## 2018-05-16 DIAGNOSIS — Z952 Presence of prosthetic heart valve: Secondary | ICD-10-CM

## 2018-05-16 DIAGNOSIS — Z951 Presence of aortocoronary bypass graft: Secondary | ICD-10-CM

## 2018-05-16 DIAGNOSIS — I350 Nonrheumatic aortic (valve) stenosis: Secondary | ICD-10-CM

## 2018-05-16 NOTE — Progress Notes (Signed)
PCP is Patient, No Pcp Per Referring Provider is Caryl Adahiu, Jenyung Andy, MD  Chief Complaint  Patient presents with  . Routine Post Op    f/u from surgery with CXR s/p AVR, CABG 04/13/18    HPI: 4-week follow-up after AVR and CABG x1  [ left IMA to LAD ] for hx severe aortic stenosis and 80% LAD stenosis Patient is morbidly obese But had no significant pulmonary problems.  He did develop atrial fibrillation was discharged home on Eliquis and amiodarone.  He has maintained sinus rhythm.  He has no chest pain or shortness of breath or edema.  The sternal incision is well-healed.  His appetite strength and sleeping pattern are improving.  He has lost about 20 pounds because of loss of appetite.  His goal is to get down to 250 pounds.  [place] [place]  Past Medical History:  Diagnosis Date  . Coronary artery disease   . Diabetes mellitus without complication (HCC)    remote history; not currently    Past Surgical History:  Procedure Laterality Date  . ANAL FISSURECTOMY    . AORTIC VALVE REPLACEMENT N/A 04/13/2018   Procedure: AORTIC VALVE REPLACEMENT (AVR);  Surgeon: Donata ClayVan Trigt, Theron AristaPeter, MD;  [facility];  Service: Open Heart Surgery;  Laterality: N/A;  . CARDIAC CATHETERIZATION  03/27/2018  . CORONARY ARTERY BYPASS GRAFT N/A 04/13/2018   Procedure: CORONARY ARTERY BYPASS GRAFTING (CABG) TIMES ONE USING LEFT INTERNAL MAMMARY ARTERY;  Surgeon: Kerin PernaVan Trigt, Yaretsi Humphres, MD;  [facility];  Service: Open Heart Surgery;  Laterality: N/A;  . GASTRIC BYPASS    . TEE WITHOUT CARDIOVERSION N/A 04/13/2018   Procedure: TRANSESOPHAGEAL ECHOCARDIOGRAM (TEE);  Surgeon: Donata ClayVan Trigt, Theron AristaPeter, MD;  [facility];  Service: Open Heart Surgery;  Laterality: N/A;    History reviewed. No pertinent family history.  Social History Social History   Tobacco Use  . Smoking status: Never Smoker  . Smokeless tobacco: Current User    Types: Chew  Substance Use Topics   . Alcohol use: Yes    Comment: occasionally  . Drug use: Never    Current Outpatient Medications  Medication Sig Dispense Refill  . amiodarone (PACERONE) 200 MG tablet Take 1 tablet (200 mg total) by mouth 2 (two) times daily. For one week;then take Amiodarone 200 mg by mouth daily thereafter 60 tablet 1  . apixaban (ELIQUIS) 5 MG TABS tablet Take 1 tablet (5 mg total) by mouth 2 (two) times daily. 60 tablet 1  . aspirin EC 81 MG tablet Take 81 mg by mouth daily.    Marland Kitchen. atorvastatin (LIPITOR) 80 MG tablet Take 80 mg by mouth daily at 6 PM.     . Glucosamine-Chondroitin (COSAMIN DS PO) Take 2 tablets by mouth.    . loratadine (CLARITIN) 10 MG tablet Take 10 mg by mouth daily as needed for allergies (for sinuses.).    Marland Kitchen. losartan (COZAAR) 50 MG tablet Take 1 tablet (50 mg total) by mouth daily. 30 tablet 1  . Methylcellulose, Laxative, (CITRUCEL) 500 MG TABS Take 1,000 mg by mouth daily.     . metoprolol succinate (TOPROL-XL) 50 MG 24 hr tablet Take 1 tablet (50 mg total) by mouth daily. 30 tablet 1  . Multiple Vitamin (MULTIVITAMIN WITH MINERALS) TABS tablet Take 1 tablet by mouth daily. Men's multivitamin    . nicotine (NICODERM CQ - DOSED IN MG/24 HOURS) 14 mg/24hr patch Place 1 patch (14 mg total)  onto the skin daily. 28 patch 0  . oxyCODONE (OXY IR/ROXICODONE) 5 MG immediate release tablet Take 5 mg by mouth every 4-6 hours PRN severe pain 28 tablet 0  . Fish Oil-Cholecalciferol (OMEGA-3 FISH OIL-VITAMIN D3 PO) Take 1 tablet by mouth daily. 1200 FISH OIL& 660 OMEGA-3 - 2000 I.U. (D3)     No current facility-administered medications for this visit.     No Known Allergies  Review of Systems   he recently had a significant upper respiratory infection with productive cough which is now resolved. He has noted within the past 3 to 4 days a floater in his right eye vision.  No eye pain.    BP 112/67 (BP Location: Right Arm, Patient Position: Sitting, Cuff Size: Large)   Pulse 61   Resp  18   Ht 6\' 2"  (1.88 m)   Wt 284 lb (128.8 kg)   SpO2 97% Comment: RA  BMI 36.46 kg/m  Physical Exam      Exam    General- alert and comfortable.  Pupils equal and reactive.  No visual field cut..  Sternum well-healed.    Neck- no JVD, no cervical adenopathy palpable, no carotid bruit   Lungs- clear without rales, wheezes   Cor- regular rate and rhythm, no murmur , gallop   Abdomen- soft, non-tender   Extremities - warm, non-tender, minimal edema   Neuro- oriented, appropriate, no focal weakness   Diagnostic Tests: Chest x-ray clear. Sternal wires intact.  Impression: Doing well after AVR CABG.  Because he has maintained sinus rhythm we will stop the amiodarone and Eliquis.  He will continue his beta-blocker Lipitor and losartan   And aspirin.  He is ready to start driving and to go to cardiac rehab.  He knows he should not lift more than 20 pounds until 3 months after surgery.  Plan: I will see the patient back in 8 weeks to assess his progress.  I have recommended that he make an appointment to see his ophthalmologist regarding his visual symptoms.   Mikey Bussing, MD [facility] [phone]

## 2018-05-23 DIAGNOSIS — E785 Hyperlipidemia, unspecified: Secondary | ICD-10-CM | POA: Insufficient documentation

## 2018-05-23 DIAGNOSIS — I519 Heart disease, unspecified: Secondary | ICD-10-CM | POA: Insufficient documentation

## 2018-05-23 DIAGNOSIS — R7989 Other specified abnormal findings of blood chemistry: Secondary | ICD-10-CM | POA: Insufficient documentation

## 2018-05-23 DIAGNOSIS — I25810 Atherosclerosis of coronary artery bypass graft(s) without angina pectoris: Secondary | ICD-10-CM | POA: Insufficient documentation

## 2018-05-23 DIAGNOSIS — Z9884 Bariatric surgery status: Secondary | ICD-10-CM | POA: Insufficient documentation

## 2018-05-23 DIAGNOSIS — I480 Paroxysmal atrial fibrillation: Secondary | ICD-10-CM | POA: Insufficient documentation

## 2018-05-28 ENCOUNTER — Other Ambulatory Visit: Payer: Self-pay | Admitting: Physician Assistant

## 2018-06-12 ENCOUNTER — Other Ambulatory Visit: Payer: Self-pay | Admitting: Physician Assistant

## 2018-07-11 ENCOUNTER — Encounter: Payer: Self-pay | Admitting: Cardiothoracic Surgery

## 2018-08-03 DIAGNOSIS — Z0000 Encounter for general adult medical examination without abnormal findings: Secondary | ICD-10-CM | POA: Insufficient documentation

## 2018-08-07 DIAGNOSIS — D509 Iron deficiency anemia, unspecified: Secondary | ICD-10-CM | POA: Insufficient documentation

## 2018-08-07 DIAGNOSIS — Z9884 Bariatric surgery status: Secondary | ICD-10-CM | POA: Insufficient documentation

## 2018-08-07 DIAGNOSIS — E039 Hypothyroidism, unspecified: Secondary | ICD-10-CM | POA: Insufficient documentation

## 2018-08-07 DIAGNOSIS — R7301 Impaired fasting glucose: Secondary | ICD-10-CM | POA: Insufficient documentation

## 2018-08-08 ENCOUNTER — Encounter: Payer: Self-pay | Admitting: Cardiothoracic Surgery

## 2018-08-08 ENCOUNTER — Ambulatory Visit (INDEPENDENT_AMBULATORY_CARE_PROVIDER_SITE_OTHER): Payer: Managed Care, Other (non HMO) | Admitting: Cardiothoracic Surgery

## 2018-08-08 VITALS — BP 140/70 | HR 61 | Resp 20 | Ht 74.0 in | Wt 287.0 lb

## 2018-08-08 DIAGNOSIS — Z952 Presence of prosthetic heart valve: Secondary | ICD-10-CM | POA: Diagnosis not present

## 2018-08-08 DIAGNOSIS — Z951 Presence of aortocoronary bypass graft: Secondary | ICD-10-CM

## 2018-08-08 DIAGNOSIS — I350 Nonrheumatic aortic (valve) stenosis: Secondary | ICD-10-CM

## 2018-08-08 DIAGNOSIS — I2510 Atherosclerotic heart disease of native coronary artery without angina pectoris: Secondary | ICD-10-CM | POA: Diagnosis not present

## 2018-08-08 NOTE — Progress Notes (Signed)
PCP is Patient, No Pcp Per Referring Provider is Caryl Ada, MD  Chief Complaint  Patient presents with  . Routine Post Op    2 month f/u    HPI: Final postop visit after AVR CABG x1 3 months ago for severe aortic stenosis and moderate LAD stenosis.  Patient is doing well.  He has returned to work.  He is involved with the [facility] rehab [facility].  His surgical incision is well-healed.  He has no symptoms of chest pain or shortness of breath or edema.  Last chest x-ray was clear.   Past Medical History:  Diagnosis Date  . Coronary artery disease   . Diabetes mellitus without complication (HCC)    remote history; not currently    Past Surgical History:  Procedure Laterality Date  . ANAL FISSURECTOMY    . AORTIC VALVE REPLACEMENT N/A 04/13/2018   Procedure: AORTIC VALVE REPLACEMENT (AVR);  Surgeon: Donata Clay, Theron Arista, MD;  Location: [facility] OR;  Service: Open Heart Surgery;  Laterality: N/A;  . CARDIAC CATHETERIZATION  03/27/2018  . CORONARY ARTERY BYPASS GRAFT N/A 04/13/2018   Procedure: CORONARY ARTERY BYPASS GRAFTING (CABG) TIMES ONE USING LEFT INTERNAL MAMMARY ARTERY;  Surgeon: Kerin Perna, MD;  Location: [facility] OR;  Service: Open Heart Surgery;  Laterality: N/A;  . GASTRIC BYPASS    . TEE WITHOUT CARDIOVERSION N/A 04/13/2018   Procedure: TRANSESOPHAGEAL ECHOCARDIOGRAM (TEE);  Surgeon: Donata Clay, Theron Arista, MD;  Location[facility] OR;  Service: Open Heart Surgery;  Laterality: N/A;    History reviewed. No pertinent family history.  Social History Social History   Tobacco Use  . Smoking status: Never Smoker  . Smokeless tobacco: Current User    Types: Chew  Substance Use Topics  . Alcohol use: Yes    Comment: occasionally  . Drug use: Never    Current Outpatient Medications  Medication Sig Dispense Refill  . aspirin EC 81 MG tablet Take 81 mg by mouth daily.    . Glucosamine-Chondroitin (COSAMIN DS PO) Take 2 tablets by mouth.    . losartan (COZAAR) 50 MG tablet  Take 1 tablet (50 mg total) by mouth daily. 30 tablet 1  . Methylcellulose, Laxative, (CITRUCEL) 500 MG TABS Take 1,000 mg by mouth daily.     . metoprolol succinate (TOPROL-XL) 50 MG 24 hr tablet Take 1 tablet (50 mg total) by mouth daily. 30 tablet 1  . Multiple Vitamin (MULTIVITAMIN WITH MINERALS) TABS tablet Take 1 tablet by mouth daily. Men's multivitamin     No current facility-administered medications for this visit.     No Known Allergies  Review of Systems  Patient trying to reduce weight, goal is 250 pounds.  He is currently at approximately 280. He complains of headache closely associated with taking the Lipitor on a daily basis.  His LDL cholesterol was measured at 29.  He was advised to stop taking the Lipitor because of the side effect and extremely low LDL level.  He is recommended to have the LDL level checked 3 months after he is off the medication.  BP 140/70   Pulse 61   Resp 20   Ht 6\' 2"  (1.88 m)   Wt 287 lb (130.2 kg)   SpO2 96% Comment: RA  BMI 36.85 kg/m  Physical Exam      Exam    General- alert and comfortable    Neck- no JVD, no cervical adenopathy palpable, no carotid bruit   Lungs- clear without rales, wheezes  Cor- regular rate and rhythm, 2/6 flow  murmur , no gallop   Abdomen- soft, non-tender   Extremities - warm, non-tender, minimal edema   Neuro- oriented, appropriate, no focal weakness   Diagnostic Tests: None  Impression: Excellent recovery after AVR CABG.  No restrictions.  Plan: Continue heart healthy diet and activity /lifestyle Importance of antibiotic prophylaxis prior to dental procedures stressed to patient.  Provided her scription for amoxicillin.  Patient return here as needed. Mikey Bussing, MD [facility] [phone]

## 2019-05-25 IMAGING — DX DG CHEST 1V PORT
1 series · 2 of 2 positions shown · non-contrast
Comparison: 04/11/2018

CLINICAL DATA: Followup CABG

EXAM:
PORTABLE CHEST 1 VIEW

[Series 1: chest · 0.14mm/px · 2 of 2 slices shown]
[im 1/2]
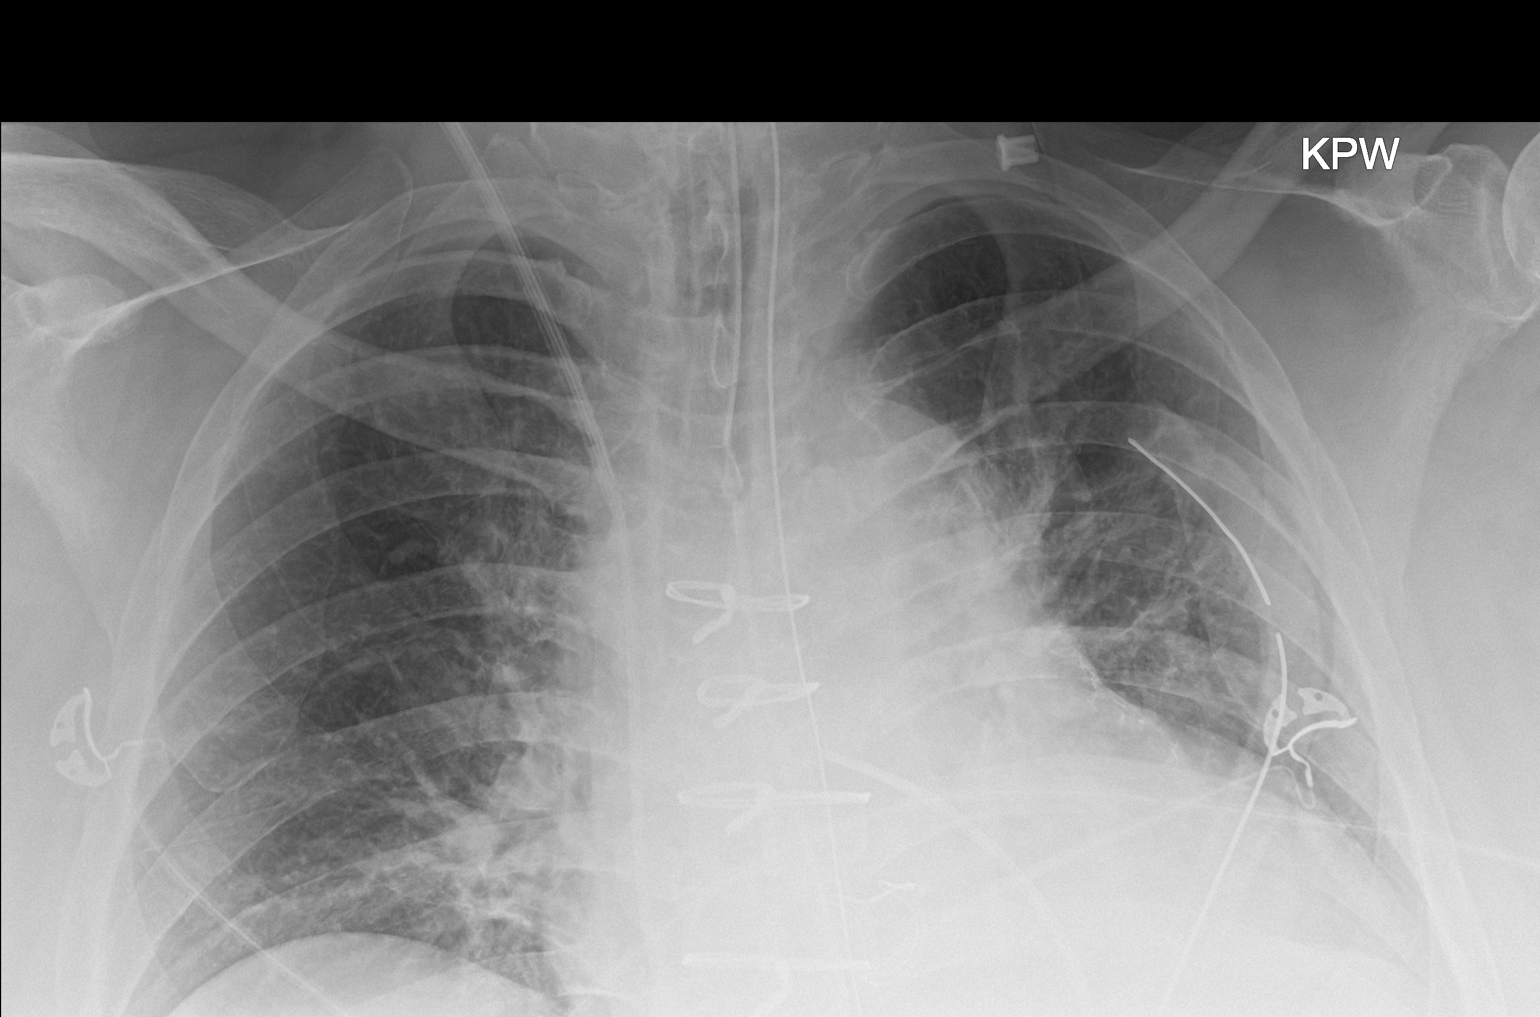
[im 2/2]
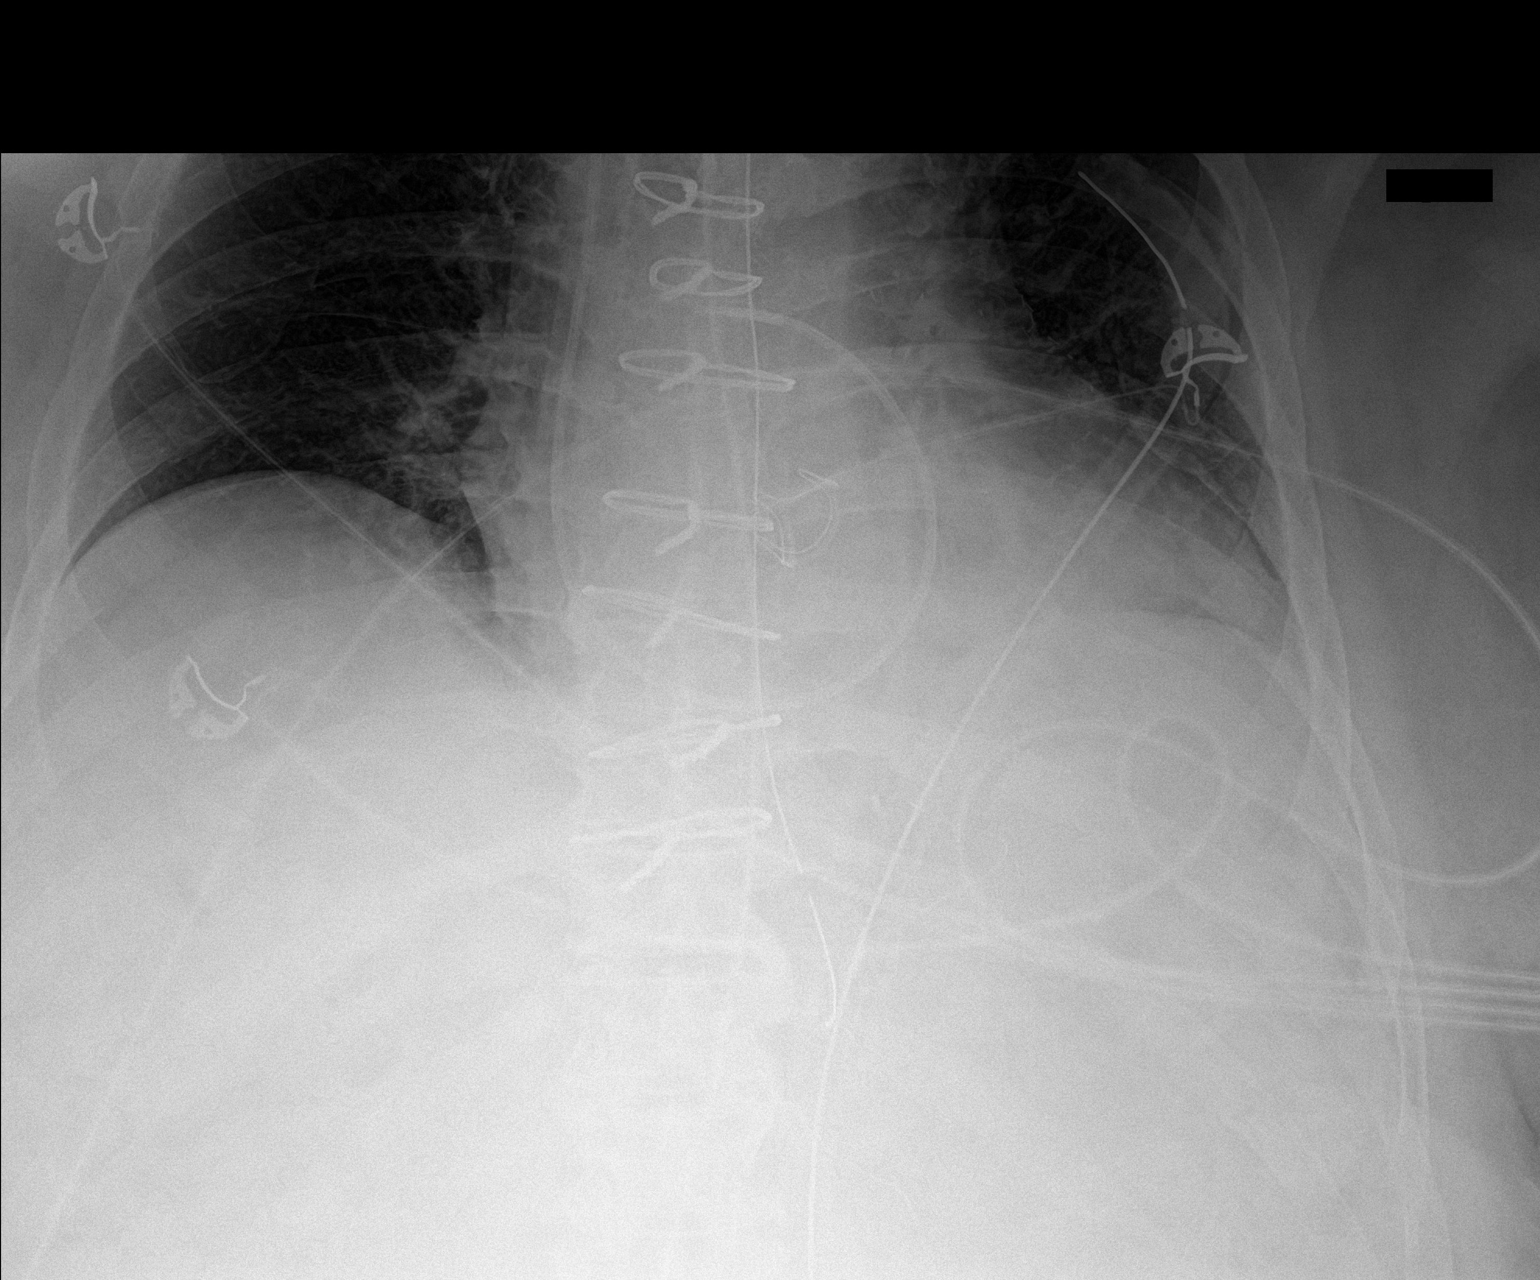

[2 of 2 positions shown; findings below may reference images not displayed]

FINDINGS: Previous median sternotomy and aortic valve replacement.
Endotracheal tube tip is 4 cm above the carina. Nasogastric tube has
its tip just in the stomach. Swan-Ganz catheter introduced from a
right internal jugular approach has its tip in the right main
pulmonary artery. Left chest tube is in place. No pneumothorax.
There is basilar atelectasis left more than right. No pulmonary
edema.
IMPRESSION: Lines and tubes well positioned following aortic valve replacement.
Basilar atelectasis left more than right. No pneumothorax.

## 2019-05-26 IMAGING — DX DG CHEST 1V PORT
1 series · 1 of 1 positions shown · non-contrast
Comparison: Chest radiograph 04/13/2018

CLINICAL DATA: Patient with chest tube in place. History CABG
procedure.

EXAM:
PORTABLE CHEST 1 VIEW

[chest ap]
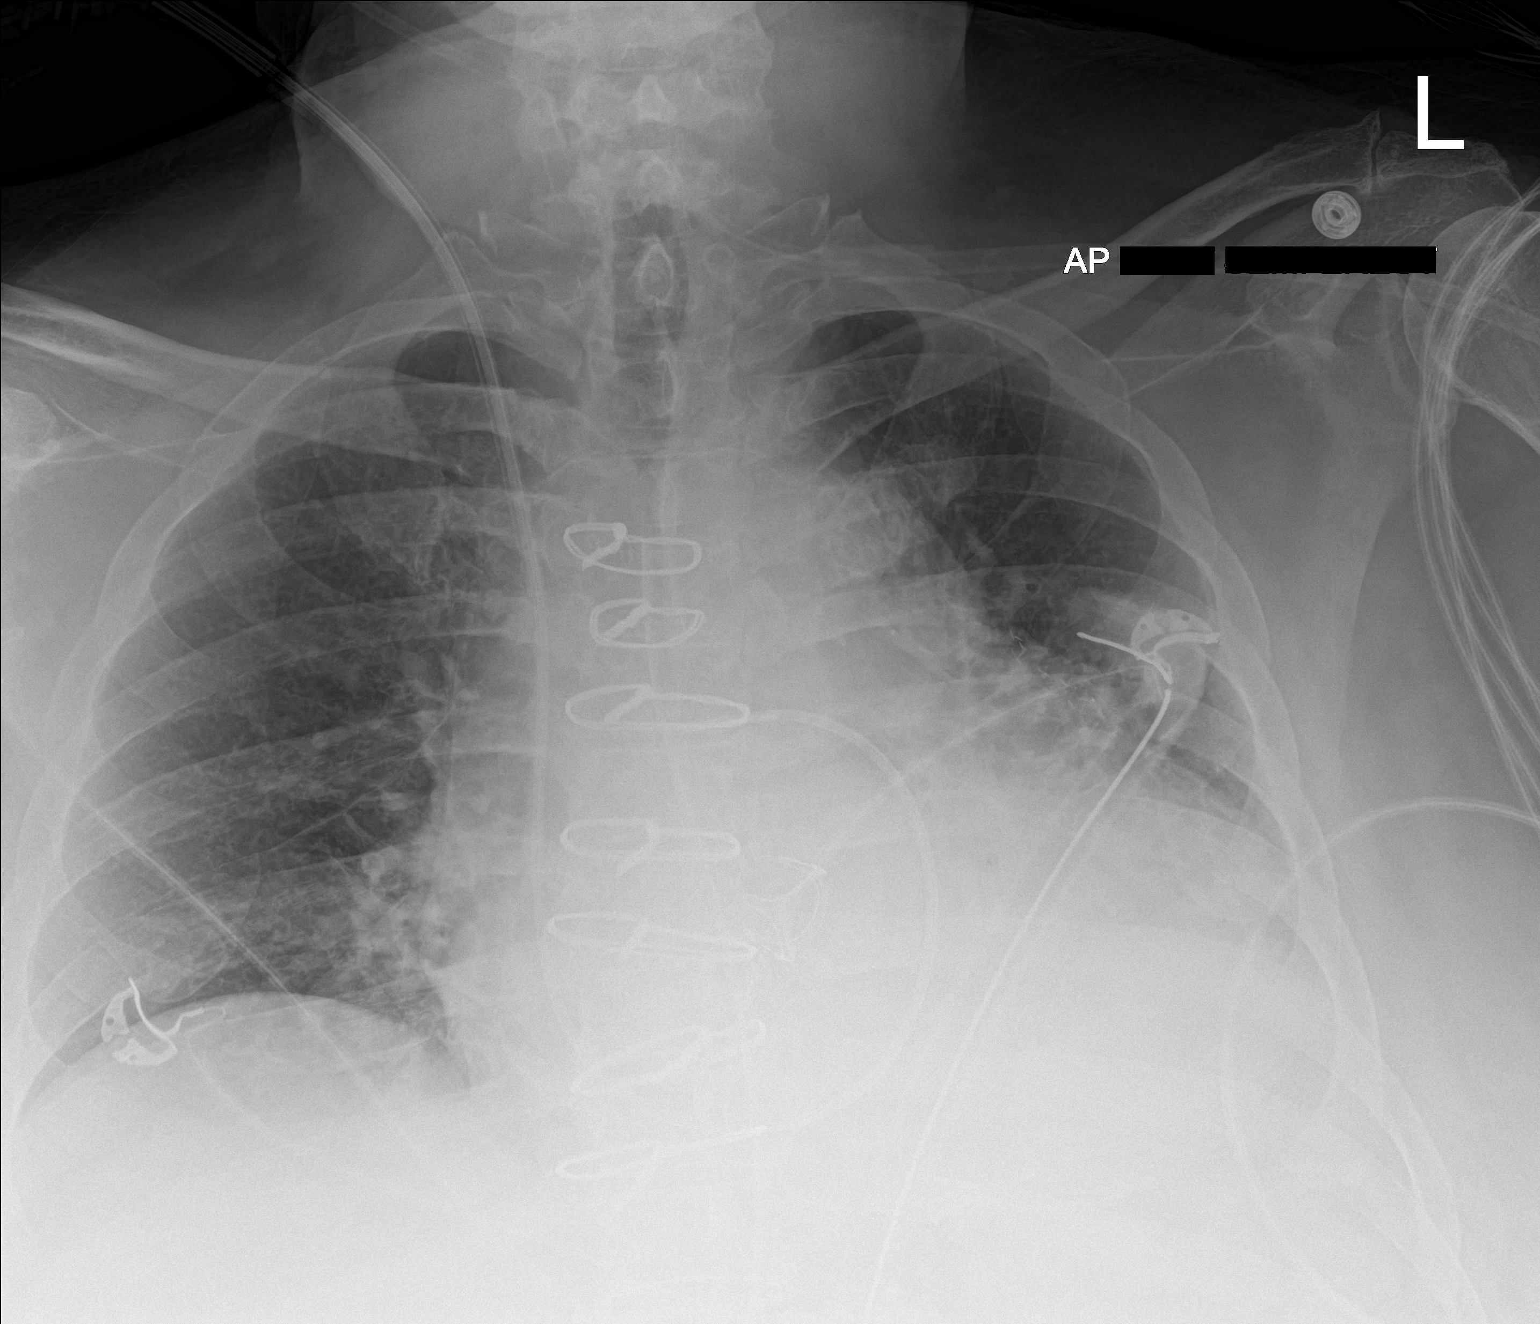

[1 of 1 positions shown; findings below may reference images not displayed]

FINDINGS: Monitoring leads overlie the patient. Interval extubation. Right IJ
central venous catheter sheath. PA catheter projects over the main
pulmonary artery. Left chest tube in place. Stable cardiomegaly. Low
lung volumes. Small left pleural effusion with underlying
atelectasis. No pneumothorax.
IMPRESSION: Low lung volumes. Small left pleural effusion with left basilar
atelectasis.

## 2019-05-31 IMAGING — CR DG CHEST 2V
2 series · 2 of 2 positions shown · non-contrast
Comparison: 04/18/2018

CLINICAL DATA: Status post aortic valve replacement

EXAM:
CHEST - 2 VIEW

[chest pa]
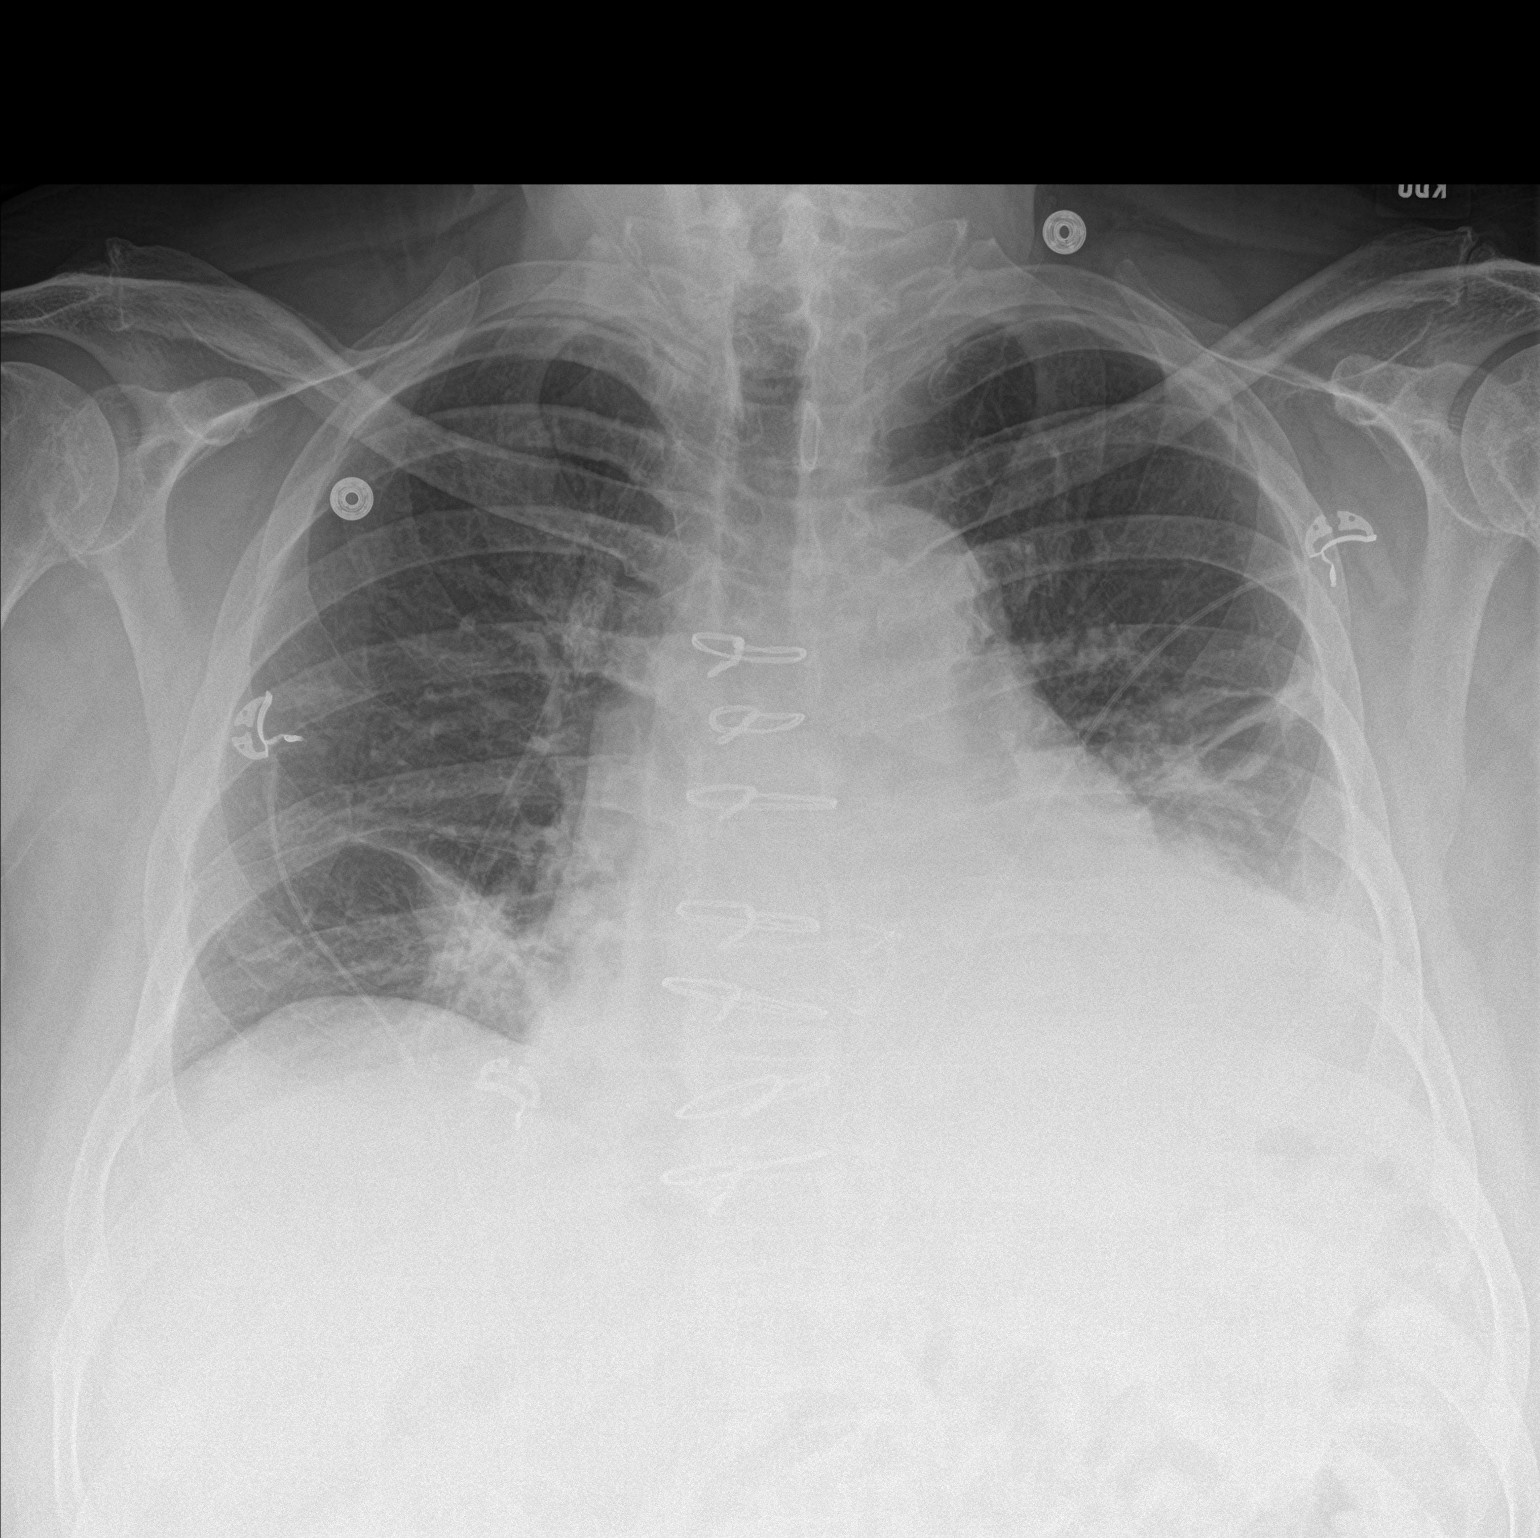

[chest lat]
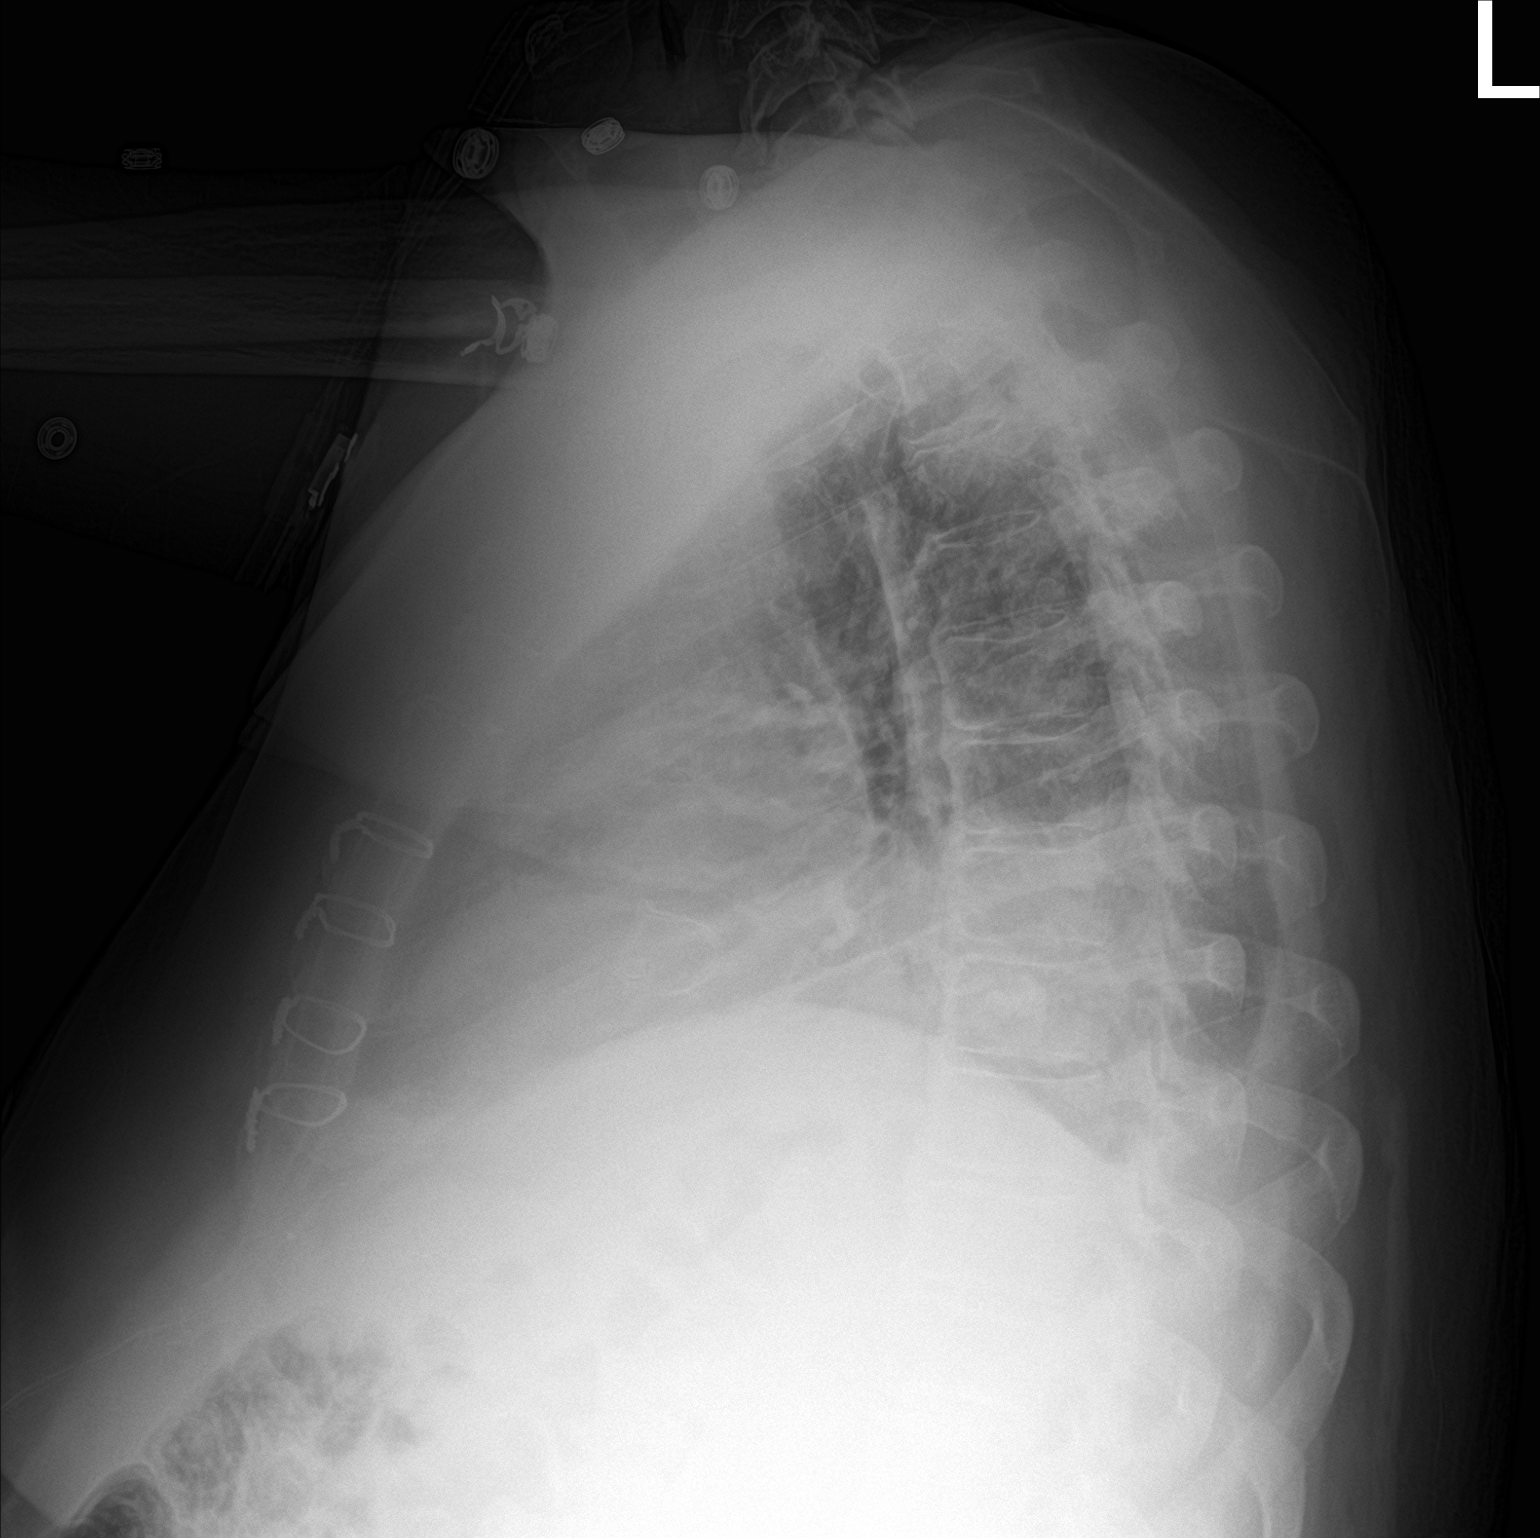

[2 of 2 positions shown; findings below may reference images not displayed]

FINDINGS: Cardiac shadow remains enlarged. Postsurgical changes are again
seen. Right jugular sheath has been removed in the interval.
Bibasilar atelectasis is noted left greater than right with small
left pleural effusion. No pneumothorax is noted.
IMPRESSION: Bibasilar changes left greater than right with small pleural
effusion.

## 2019-06-01 IMAGING — US IR CHEST US
1 series · 3 of 3 positions shown · non-contrast
Comparison: None.

CLINICAL DATA: Evaluate for pleural fluid and thoracentesis.

EXAM:
CHEST ULTRASOUND

[Series 1: ir (id) (id)/(id)/(id) ir · 3 of 3 slices shown]
[im 1/3]
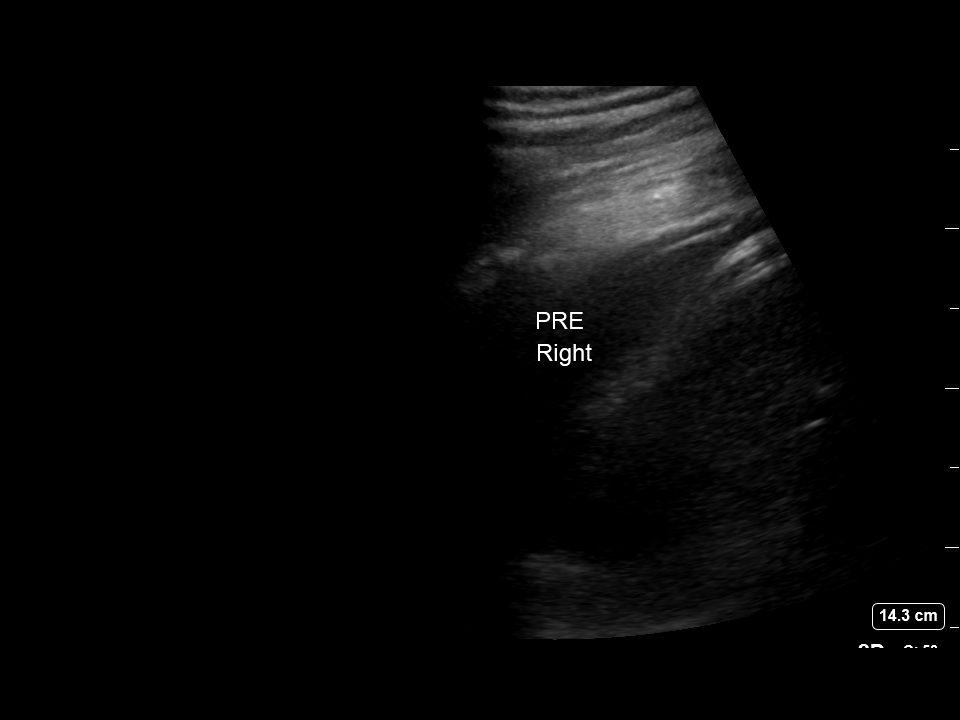
[im 2/3]
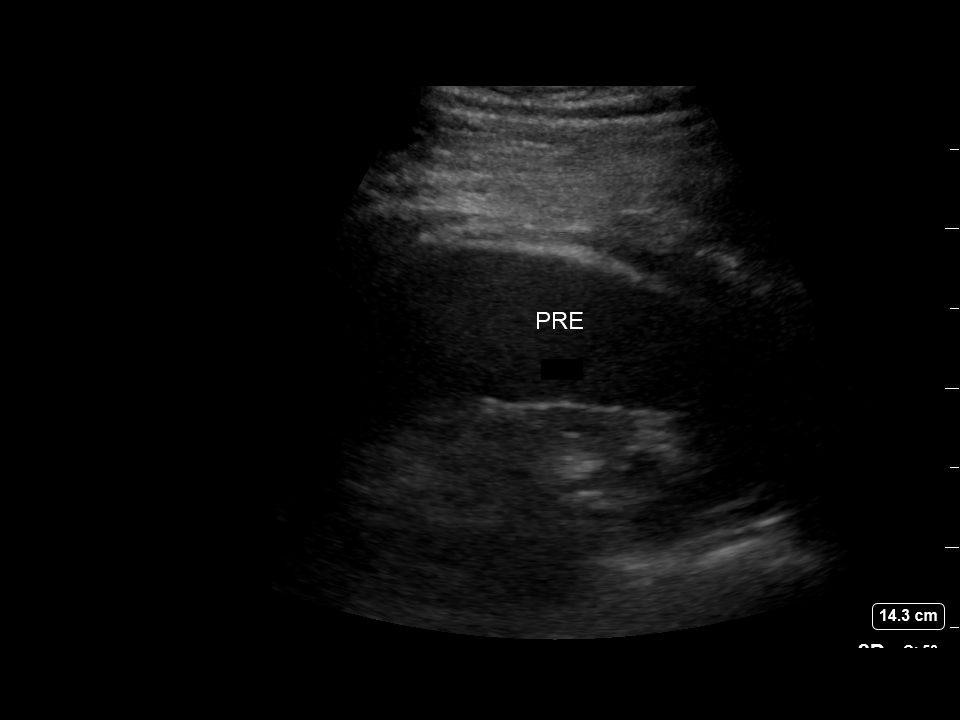
[im 3/3]
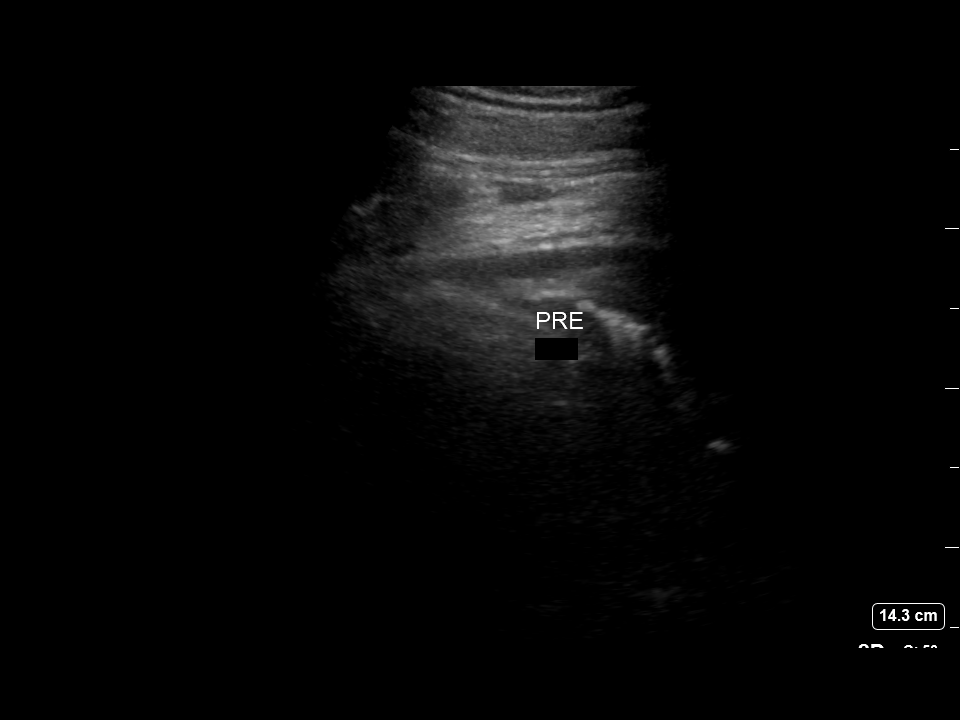

[3 of 3 positions shown; findings below may reference images not displayed]

FINDINGS: Sonographic evaluation of the right and left hemi thoraces
demonstrates no significant pleural effusion.
IMPRESSION: There is no significant pleural effusion in the right or left
hemithorax. Thoracentesis was not performed.
